# Patient Record
Sex: Female | Born: 1970
Health system: Southern US, Community
[De-identification: ages and names within clinical notes are randomized; demographics above are authoritative.]

## PROBLEM LIST (undated history)

## (undated) DIAGNOSIS — T7840XA Allergy, unspecified, initial encounter: Secondary | ICD-10-CM

## (undated) DIAGNOSIS — I499 Cardiac arrhythmia, unspecified: Secondary | ICD-10-CM

## (undated) DIAGNOSIS — R112 Nausea with vomiting, unspecified: Secondary | ICD-10-CM

## (undated) DIAGNOSIS — Z8709 Personal history of other diseases of the respiratory system: Secondary | ICD-10-CM

## (undated) DIAGNOSIS — M1990 Unspecified osteoarthritis, unspecified site: Secondary | ICD-10-CM

## (undated) DIAGNOSIS — J329 Chronic sinusitis, unspecified: Secondary | ICD-10-CM

## (undated) DIAGNOSIS — F32A Depression, unspecified: Secondary | ICD-10-CM

## (undated) DIAGNOSIS — R05 Cough: Secondary | ICD-10-CM

## (undated) DIAGNOSIS — G43909 Migraine, unspecified, not intractable, without status migrainosus: Secondary | ICD-10-CM

## (undated) DIAGNOSIS — J45909 Unspecified asthma, uncomplicated: Secondary | ICD-10-CM

## (undated) DIAGNOSIS — E785 Hyperlipidemia, unspecified: Secondary | ICD-10-CM

## (undated) DIAGNOSIS — Z9889 Other specified postprocedural states: Secondary | ICD-10-CM

## (undated) DIAGNOSIS — F419 Anxiety disorder, unspecified: Secondary | ICD-10-CM

## (undated) DIAGNOSIS — I10 Essential (primary) hypertension: Secondary | ICD-10-CM

## (undated) DIAGNOSIS — F329 Major depressive disorder, single episode, unspecified: Secondary | ICD-10-CM

## (undated) DIAGNOSIS — Z973 Presence of spectacles and contact lenses: Secondary | ICD-10-CM

## (undated) HISTORY — DX: Allergy, unspecified, initial encounter: T78.40XA

## (undated) HISTORY — PX: WISDOM TOOTH EXTRACTION: SHX21

## (undated) HISTORY — DX: Migraine, unspecified, not intractable, without status migrainosus: G43.909

## (undated) HISTORY — DX: Unspecified osteoarthritis, unspecified site: M19.90

## (undated) HISTORY — DX: Cardiac arrhythmia, unspecified: I49.9

## (undated) HISTORY — DX: Hyperlipidemia, unspecified: E78.5

## (undated) HISTORY — DX: Anxiety disorder, unspecified: F41.9

## (undated) HISTORY — DX: Cough: R05

## (undated) HISTORY — PX: TONSILLECTOMY AND ADENOIDECTOMY: SUR1326

## (undated) HISTORY — DX: Essential (primary) hypertension: I10

## (undated) HISTORY — DX: Chronic sinusitis, unspecified: J32.9

---

## 1999-05-31 ENCOUNTER — Other Ambulatory Visit: Admission: RE | Admit: 1999-05-31 | Discharge: 1999-05-31 | Payer: Self-pay | Admitting: Family Medicine

## 2000-06-27 ENCOUNTER — Other Ambulatory Visit: Admission: RE | Admit: 2000-06-27 | Discharge: 2000-06-27 | Payer: Self-pay | Admitting: Family Medicine

## 2001-09-07 ENCOUNTER — Other Ambulatory Visit: Admission: RE | Admit: 2001-09-07 | Discharge: 2001-09-07 | Payer: Self-pay | Admitting: Family Medicine

## 2002-10-15 ENCOUNTER — Other Ambulatory Visit: Admission: RE | Admit: 2002-10-15 | Discharge: 2002-10-15 | Payer: Self-pay | Admitting: Family Medicine

## 2004-09-14 ENCOUNTER — Ambulatory Visit: Payer: Self-pay | Admitting: Internal Medicine

## 2004-10-21 ENCOUNTER — Ambulatory Visit: Payer: Self-pay | Admitting: Family Medicine

## 2008-12-01 ENCOUNTER — Encounter (INDEPENDENT_AMBULATORY_CARE_PROVIDER_SITE_OTHER): Payer: Self-pay | Admitting: *Deleted

## 2008-12-17 ENCOUNTER — Ambulatory Visit: Payer: Self-pay | Admitting: Family Medicine

## 2008-12-17 DIAGNOSIS — J069 Acute upper respiratory infection, unspecified: Secondary | ICD-10-CM | POA: Insufficient documentation

## 2008-12-17 DIAGNOSIS — M5432 Sciatica, left side: Secondary | ICD-10-CM | POA: Insufficient documentation

## 2008-12-19 ENCOUNTER — Encounter (INDEPENDENT_AMBULATORY_CARE_PROVIDER_SITE_OTHER): Payer: Self-pay | Admitting: *Deleted

## 2008-12-19 LAB — CONVERTED CEMR LAB
ALT: 19 units/L (ref 0–35)
Albumin: 4.2 g/dL (ref 3.5–5.2)
CO2: 30 meq/L (ref 19–32)
Calcium: 9 mg/dL (ref 8.4–10.5)
Creatinine, Ser: 0.5 mg/dL (ref 0.4–1.2)
GFR calc non Af Amer: 145.43 mL/min (ref >60)
Glucose, Bld: 88 mg/dL (ref 70–99)
HDL: 38.5 mg/dL — ABNORMAL LOW (ref >39.00)
Hemoglobin: 14.5 g/dL (ref 12.0–15.0)
Lymphocytes Relative: 17.5 % (ref 12.0–46.0)
MCHC: 34.4 g/dL (ref 30.0–36.0)
Monocytes Absolute: 0.8 10*3/uL (ref 0.1–1.0)
Monocytes Relative: 7.4 % (ref 3.0–12.0)
Neutrophils Relative %: 69.6 % (ref 43.0–77.0)
RDW: 11.8 % (ref 11.5–14.6)
Sodium: 143 meq/L (ref 135–145)
TSH: 1.04 microintl units/mL (ref 0.35–5.50)
Total CHOL/HDL Ratio: 5
Total Protein: 7.9 g/dL (ref 6.0–8.3)
Triglycerides: 82 mg/dL (ref 0.0–149.0)
VLDL: 16.4 mg/dL (ref 0.0–40.0)

## 2009-01-07 ENCOUNTER — Ambulatory Visit: Payer: Self-pay | Admitting: Family Medicine

## 2011-04-26 LAB — HM PAP SMEAR

## 2011-05-19 ENCOUNTER — Institutional Professional Consult (permissible substitution): Payer: Self-pay | Admitting: Internal Medicine

## 2011-05-27 ENCOUNTER — Encounter: Payer: Self-pay | Admitting: Internal Medicine

## 2011-05-30 ENCOUNTER — Encounter: Payer: Self-pay | Admitting: Internal Medicine

## 2011-05-30 ENCOUNTER — Ambulatory Visit (INDEPENDENT_AMBULATORY_CARE_PROVIDER_SITE_OTHER): Payer: BC Managed Care – PPO | Admitting: Internal Medicine

## 2011-05-30 ENCOUNTER — Telehealth: Payer: Self-pay | Admitting: Internal Medicine

## 2011-05-30 VITALS — BP 152/90 | HR 114 | Temp 98.3°F | Ht 64.0 in | Wt 245.6 lb

## 2011-05-30 DIAGNOSIS — R05 Cough: Secondary | ICD-10-CM

## 2011-05-30 DIAGNOSIS — R059 Cough, unspecified: Secondary | ICD-10-CM

## 2011-05-30 MED ORDER — AMOXICILLIN-POT CLAVULANATE 875-125 MG PO TABS: 1.0000 | ORAL_TABLET | Freq: Two times a day (BID) | ORAL | Status: AC

## 2011-05-30 MED ORDER — FAMOTIDINE 20 MG PO TABS: 20.0000 mg | ORAL_TABLET | Freq: Every day | ORAL | Status: DC

## 2011-05-30 MED ORDER — PREDNISONE (PAK) 10 MG PO TABS: ORAL_TABLET | ORAL | Status: AC

## 2011-05-30 NOTE — Telephone Encounter (Signed)
Pepcid RX sent to [facility] on [address]. Pharmacist aware.

## 2011-05-30 NOTE — Patient Instructions (Addendum)
Augmentin 875 mg one twice daily(bfast and suppper with large glass of water) x 10 days - eat yogurt for lunch.  Prednisone 10 mg take  4 each am x 2 days,   2 each am x 2 days,  1 each am x2days and stop    Try prilosec 20mg   Take 30-60 min before first meal of the day and Pepcid 20 mg one bedtime until cough is completely gone for at least a week without the need for cough suppression  I think of reflux for chronic cough like I do oxygen for fire (doesn't cause the fire but once you get the oxygen suppressed it usually goes away regardless of the exact cause).   GERD (REFLUX)  is an extremely common cause of respiratory symptoms, many times with no significant heartburn at all.    It can be treated with medication, but also with lifestyle changes including avoidance of late meals, excessive alcohol, smoking cessation, and avoid fatty foods, chocolate, peppermint, colas, red wine, and acidic juices such as orange juice.  NO MINT OR MENTHOL PRODUCTS SO NO COUGH DROPS  USE SUGARLESS CANDY INSTEAD (jolley ranchers or Stover's)  NO OIL BASED VITAMINS - use powdered substitutes.  Please schedule a follow up office visit in 2 weeks, sooner if needed

## 2011-05-30 NOTE — Progress Notes (Signed)
  Subjective:    Patient ID: Kayla Combs, female    DOB: Jul 20, 1970, 41 y.o.   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 / h/o smoking quit Dec 2012 with dx asthma as child with need ventolin need outgrew by age teens but always tendencies for colds to chest c occ need for prednisone and advair but not maintained on it then onset of uri Nov 2012 and coughing ever since so referred by [facility] 05/30/2011 for refractory cough   05/30/2011 1st pulmonary eval cc acute onset cough with uri mid Nov 2012  quit smoking early Dec 2012 with swine flu when return from [place]  By car overall by day of ov but cough daily still esp in am's after stirring but no longer coughing to point of vomiting or urinary incontinence p 3 ov's with last rx prednisone and tamiflu and zpak.  Cough is productive of dark yellow.  Not feeling need for ventolin and has not used advair this time. No overt hb now.  Sleeping ok without nocturnal  or early am awakening with exacerbation  of respiratory  c/o's or need for noct saba. Also denies any obvious fluctuation of symptoms with weather or environmental changes or other aggravating or alleviating factors except as outlined above.    Review of Systems  Constitutional: Negative for fever, chills and unexpected weight change.  HENT: Positive for ear pain, congestion and sneezing. Negative for nosebleeds, sore throat, rhinorrhea, trouble swallowing, dental problem, voice change, postnasal drip and sinus pressure.   Eyes: Negative for visual disturbance.  Respiratory: Positive for cough. Negative for choking and shortness of breath.   Cardiovascular: Negative for chest pain and leg swelling.  Gastrointestinal: Negative for vomiting, abdominal pain and diarrhea.  Genitourinary: Negative for difficulty urinating.  Musculoskeletal: Negative for arthralgias.  Skin: Negative for rash.  Neurological: Negative for tremors, syncope and  headaches.  Hematological: Does not bruise/bleed easily.       Objective:   Physical Exam  amb obese wf nad   Wt 245 05/30/2011   HEENT: nl dentition, turbinates, and orophanx. Nl external ear canals without cough reflex   NECK :  without JVD/Nodes/TM/ nl carotid upstrokes bilaterally   LUNGS: no acc muscle use, clear to A and P bilaterally without cough on insp or exp maneuvers   CV:  RRR  no s3 or murmur or increase in P2, no edema   ABD:  soft and nontender with nl excursion in the supine position. No bruits or organomegaly, bowel sounds nl  MS:  warm without deformities, calf tenderness, cyanosis or clubbing  SKIN: warm and dry without lesions    NEURO:  alert, approp, no deficits     Outside cxr reviewed from 05/05/11 and is nl    Assessment & Plan:

## 2011-05-31 DIAGNOSIS — R059 Cough, unspecified: Secondary | ICD-10-CM | POA: Insufficient documentation

## 2011-05-31 DIAGNOSIS — R05 Cough: Secondary | ICD-10-CM | POA: Insufficient documentation

## 2011-05-31 NOTE — Assessment & Plan Note (Signed)
The most common causes of chronic cough in immunocompetent adults include the following: upper airway cough syndrome (UACS), previously referred to as postnasal drip syndrome (PNDS), which is caused by variety of rhinosinus conditions; (2) asthma; (3) GERD; (4) chronic bronchitis from cigarette smoking or other inhaled environmental irritants; (5) nonasthmatic eosinophilic bronchitis; and (6) bronchiectasis.   These conditions, singly or in combination, have accounted for up to 94% of the causes of chronic cough in prospective studies.   Other conditions have constituted no >6% of the causes in prospective studies These have included bronchogenic carcinoma, chronic interstitial pneumonia, sarcoidosis, left ventricular failure, ACEI-induced cough, and aspiration from a condition associated with pharyngeal dysfunction.   .Chronic cough is often simultaneously caused by more than one condition. A single cause has been found from 38 to 82% of the time, multiple causes from 18 to 62%. Multiply caused cough has been the result of three diseases up to 42% of the time.      This is most likely  Classic Upper airway cough syndrome, so named because it's frequently impossible to sort out how much is  CR/sinusitis with freq throat clearing (which can be related to primary GERD)   vs  causing  secondary (" extra esophageal")  GERD from wide swings in gastric pressure that occur with throat clearing, often  promoting self use of mint and menthol lozenges that reduce the lower esophageal sphincter tone and exacerbate the problem further in a cyclical fashion.   These are the same pts who not infrequently have failed to tolerate ace inhibitors,  dry powder inhalers or biphosphonates or report having reflux symptoms that don't respond to standard doses of PPI , and are easily confused as having aecopd or asthma flares.  For now rx for gerd and sinusitis then next step is sinus CT

## 2011-06-13 ENCOUNTER — Ambulatory Visit: Payer: BC Managed Care – PPO | Admitting: Internal Medicine

## 2011-06-20 ENCOUNTER — Other Ambulatory Visit (INDEPENDENT_AMBULATORY_CARE_PROVIDER_SITE_OTHER): Payer: BC Managed Care – PPO

## 2011-06-20 ENCOUNTER — Encounter: Payer: Self-pay | Admitting: Internal Medicine

## 2011-06-20 ENCOUNTER — Ambulatory Visit (INDEPENDENT_AMBULATORY_CARE_PROVIDER_SITE_OTHER)
Admission: RE | Admit: 2011-06-20 | Discharge: 2011-06-20 | Disposition: A | Discharge status: Home / Self Care | Payer: BC Managed Care – PPO | Source: Ambulatory Visit | Attending: Internal Medicine | Admitting: Internal Medicine

## 2011-06-20 ENCOUNTER — Ambulatory Visit (INDEPENDENT_AMBULATORY_CARE_PROVIDER_SITE_OTHER): Payer: BC Managed Care – PPO | Admitting: Internal Medicine

## 2011-06-20 VITALS — BP 152/86 | HR 120 | Temp 97.9°F | Ht 64.0 in | Wt 247.0 lb

## 2011-06-20 DIAGNOSIS — R059 Cough, unspecified: Secondary | ICD-10-CM

## 2011-06-20 DIAGNOSIS — R05 Cough: Secondary | ICD-10-CM

## 2011-06-20 LAB — CBC WITH DIFFERENTIAL/PLATELET
Basophils Absolute: 0.2 10*3/uL — ABNORMAL HIGH (ref 0.0–0.1)
Eosinophils Absolute: 0.4 10*3/uL (ref 0.0–0.7)
Hemoglobin: 14.7 g/dL (ref 12.0–15.0)
Lymphocytes Relative: 19.3 % (ref 12.0–46.0)
Monocytes Relative: 5.3 % (ref 3.0–12.0)
Neutro Abs: 8.6 10*3/uL — ABNORMAL HIGH (ref 1.4–7.7)
Neutrophils Relative %: 70.8 % (ref 43.0–77.0)
RBC: 4.91 Mil/uL (ref 3.87–5.11)
RDW: 13.6 % (ref 11.5–14.6)

## 2011-06-20 MED ORDER — TRAMADOL HCL 50 MG PO TABS: ORAL_TABLET | ORAL | Status: AC

## 2011-06-20 MED ORDER — PREDNISONE (PAK) 10 MG PO TABS: ORAL_TABLET | ORAL | Status: AC

## 2011-06-20 NOTE — Patient Instructions (Addendum)
The key to effective treatment for your cough is eliminating the non-stop cycle of cough you're stuck in long enough to let your airway heal completely and then see if there is anything still making you cough once you stop the cough suppression, but this should take no more than 5 days to figuure out  First take delsym two tsp every 12 hours and supplement if needed with  tramadol 50 mg up to 2 every 4 hours to suppress the urge to cough. Swallowing water or using ice chips/non mint and menthol containing candies (such as lifesavers or sugarless jolly ranchers) are also effective.  You should rest your voice and avoid activities that you know make you cough.  Once you have eliminated the cough for 3 straight days try reducing the tramadol first,  then the delsym as tolerated.    Try prilosec 20mg   Take 30-60 min before first meal of the day and Pepcid 20 mg one bedtime until cough is completely gone for at least a week without the need for cough suppression  I think of reflux for chronic cough like I do oxygen for fire (doesn't cause the fire but once you get the oxygen suppressed it usually goes away regardless of the exact cause).  GERD (REFLUX)  is an extremely common cause of respiratory symptoms, many times with no significant heartburn at all.    It can be treated with medication, but also with lifestyle changes including avoidance of late meals, excessive alcohol, smoking cessation, and avoid fatty foods, chocolate, peppermint, colas, red wine, and acidic juices such as orange juice.  NO MINT OR MENTHOL PRODUCTS SO NO COUGH DROPS  USE SUGARLESS CANDY INSTEAD (jolley ranchers or Stover's)  NO OIL BASED VITAMINS - use powdered substitutes.    Prednisone 10 mg take  4 each am x 2 days,   2 each am x 2 days,  1 each am x2days and stop    Please remember to go to the lab and x-ray department downstairs for your tests - we will call you with the results when they are available.     Please see  patient coordinator before you leave today  to schedule Sinus CT next - if this is negative I recommend chlortrimeton 4 mg every 6 hours as needed for drainage.   If you are satisfied with your treatment plan let your doctor know and he/she can either refill your medications or you can return here when your prescription runs out.     If in any way you are not 100% satisfied,  please tell us.  If 100% better, tell your friends!

## 2011-06-20 NOTE — Assessment & Plan Note (Signed)
Continue to feel this is most likely  Classic Upper airway cough syndrome, so named because it's frequently impossible to sort out how much is  CR/sinusitis with freq throat clearing (which can be related to primary GERD)   vs  causing  secondary (" extra esophageal")  GERD from wide swings in gastric pressure that occur with throat clearing, often  promoting self use of mint and menthol lozenges that reduce the lower esophageal sphincter tone and exacerbate the problem further in a cyclical fashion.   These are the same pts (now being labeled as having "irritable larynx syndrome" by some cough centers) who not infrequently have a history of having failed to tolerate ace inhibitors,  dry powder inhalers or biphosphonates or report having atypical reflux symptoms that don't respond to standard doses of PPI , and are easily confused as having aecopd or asthma flares by even experienced allergists/ pulmonologists.   Next step is eval for sinusitis and if neg then add 1st gen H1 per guidelines.

## 2011-06-20 NOTE — Progress Notes (Signed)
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   05/30/2011 1st pulmonary eval cc acute onset cough with uri mid Nov 2012  quit smoking early Dec 2012 with swine flu when return from [place]  By car overall by day of ov but cough daily still esp in am's after stirring but no longer coughing to point of vomiting or urinary incontinence p 3 ov's with last rx prednisone and tamiflu and zpak.  Cough is productive of dark yellow.  Not feeling need for ventolin and has not used advair this time. No overt hb now. rec Augmentin 875 mg one twice daily(bfast and suppper with large glass of water) x 10 days - eat yogurt for lunch. Prednisone 10 mg take  4 each am x 2 days,   2 each am x 2 days,  1 each am x2days and stop  Try prilosec 20mg   Take 30-60 min before first meal of the day and Pepcid 20 mg one bedtime until cough is completely gone for at least a week without the need for cough suppression GERD diet  Please schedule a follow up office visit in 2 weeks, sooner if needed     06/20/2011 f/u ov/Michaelanthony Kempton 75% better p finished rx and then  Worse again abuptly since Feb 12 th with increase cough with slt yellow mucus,  Then noted breathing worse since the 14th even if not coughing.  Has not tried ventolin. Breathing is bad at rest, not always proportionate to activities. Mucus turned yellow again after had cleared somewhat on augmentin.    Sleeping ok without nocturnal  or early am awakening with exacerbation  of respiratory  c/o's or need for noct saba. Also denies any obvious fluctuation  of symptoms with weather or environmental changes or other aggravating or alleviating factors except as outlined above.  ROS  At present neg for  any significant sore throat, dysphagia, itching, sneezing,  nasal congestion or excess/ purulent secretions,  fever, chills, sweats, unintended wt loss, pleuritic or exertional cp, hempoptysis, orthopnea pnd or leg swelling.  Also denies presyncope, palpitations, heartburn, abdominal pain, nausea, vomiting, diarrhea  or change in bowel or urinary habits, dysuria,hematuria,  rash, arthralgias, visual complaints, headache, numbness weakness or ataxia.      Objective:   Physical Exam  amb obese wf nad   Wt 245 05/30/2011 > 06/20/2011 247  HEENT: nl dentition, turbinates, and orophanx. Nl external ear canals without cough reflex   NECK :  without JVD/Nodes/TM/ nl carotid upstrokes bilaterally   LUNGS: no acc muscle use, clear to A and P bilaterally without cough on insp or exp maneuvers   CV:  RRR  no s3 or murmur or increase in P2, no edema   ABD:  soft and nontender with nl excursion in the supine position. No bruits or organomegaly, bowel sounds nl  MS:  warm without deformities, calf tenderness, cyanosis or clubbing       Outside cxr reviewed from 05/05/11 and is nl  CXR  06/20/2011 :  No acute cardiopulmonary findings.      Assessment & Plan:

## 2011-06-21 ENCOUNTER — Other Ambulatory Visit: Payer: Self-pay | Admitting: Internal Medicine

## 2011-06-21 ENCOUNTER — Ambulatory Visit (INDEPENDENT_AMBULATORY_CARE_PROVIDER_SITE_OTHER)
Admission: RE | Admit: 2011-06-21 | Discharge: 2011-06-21 | Disposition: A | Discharge status: Home / Self Care | Payer: BC Managed Care – PPO | Source: Ambulatory Visit | Attending: Cardiovascular Disease | Admitting: Cardiovascular Disease

## 2011-06-21 ENCOUNTER — Encounter: Payer: Self-pay | Admitting: Internal Medicine

## 2011-06-21 DIAGNOSIS — R059 Cough, unspecified: Secondary | ICD-10-CM

## 2011-06-21 DIAGNOSIS — R05 Cough: Secondary | ICD-10-CM

## 2011-06-21 DIAGNOSIS — J329 Chronic sinusitis, unspecified: Secondary | ICD-10-CM

## 2011-06-21 MED ORDER — AMOXICILLIN-POT CLAVULANATE 875-125 MG PO TABS: 1.0000 | ORAL_TABLET | Freq: Two times a day (BID) | ORAL | Status: DC

## 2011-06-21 NOTE — Progress Notes (Signed)
Quick Note:  Spoke with pt and notified of results per Dr. Wert. Pt verbalized understanding and denied any questions.  ______ 

## 2011-06-22 ENCOUNTER — Encounter: Payer: Self-pay | Admitting: Internal Medicine

## 2011-06-22 LAB — ALLERGY PROFILE REGION II-DC, DE, MD, ~~LOC~~, VA
Aspergillus fumigatus, IgG: <0.1 kU/L (ref <0.35)
Bermuda Grass: <0.1 kU/L (ref <0.35)
Cladosporium Herbarum: <0.1 kU/L (ref <0.35)
Common Ragweed: <0.1 kU/L (ref <0.35)
Elm IgE: <0.1 kU/L (ref <0.35)
IgE (Immunoglobulin E), Serum: 12.1 IU/mL (ref 0.0–180.0)
Johnson Grass: <0.1 kU/L (ref <0.35)
Lamb's Quarters: <0.1 kU/L (ref <0.35)
Meadow Grass: <0.1 kU/L (ref <0.35)

## 2011-06-22 NOTE — Progress Notes (Signed)
Quick Note:  Spoke with pt and notified of results per Dr. Wert. Pt verbalized understanding and denied any questions.  ______ 

## 2011-06-28 ENCOUNTER — Telehealth: Payer: Self-pay | Admitting: Internal Medicine

## 2011-06-28 NOTE — Telephone Encounter (Signed)
Pt had a ct of sinus on 06-21-11 which was positive for sinusitis. The instructions are as follows per MW:  Notes Recorded by Sandrea Hughs, MD on 06/21/2011 at 3:05 PM Call patient : Study c/w acute and chronic sinusits > rec aumentin 875 bid x 21 days then repeat (other option is go directly to ent)   But the CT was scheduled for 21days after she took the last dose of abx so it needs to be r/s. I called [facility] and had CT changed to 07-13-11 at 10am. Pt aware. Carron Curie, CMA

## 2011-06-30 ENCOUNTER — Telehealth: Payer: Self-pay | Admitting: Internal Medicine

## 2011-06-30 MED ORDER — AMOXICILLIN-POT CLAVULANATE 875-125 MG PO TABS: 1.0000 | ORAL_TABLET | Freq: Two times a day (BID) | ORAL | Status: AC

## 2011-06-30 NOTE — Telephone Encounter (Signed)
Rx was corrected and sent to pharm. Should have been for # 42 tablets and so I sent the other # 21 tablets. Pt aware.

## 2011-07-13 ENCOUNTER — Encounter: Payer: Self-pay | Admitting: Internal Medicine

## 2011-07-13 ENCOUNTER — Ambulatory Visit (INDEPENDENT_AMBULATORY_CARE_PROVIDER_SITE_OTHER)
Admission: RE | Admit: 2011-07-13 | Discharge: 2011-07-13 | Disposition: A | Discharge status: Home / Self Care | Payer: BC Managed Care – PPO | Source: Ambulatory Visit | Attending: Internal Medicine | Admitting: Internal Medicine

## 2011-07-13 DIAGNOSIS — J329 Chronic sinusitis, unspecified: Secondary | ICD-10-CM

## 2011-07-14 ENCOUNTER — Telehealth: Payer: Self-pay | Admitting: Internal Medicine

## 2011-07-14 DIAGNOSIS — K7290 Hepatic failure, unspecified without coma: Secondary | ICD-10-CM

## 2011-07-14 DIAGNOSIS — J329 Chronic sinusitis, unspecified: Secondary | ICD-10-CM

## 2011-07-14 NOTE — Telephone Encounter (Signed)
Spoke with pt and notified of results per Dr. Sherene Sires. Pt verbalized understanding and denied any questions. Referral to ENT was made.

## 2011-07-14 NOTE — Progress Notes (Signed)
Quick Note:  Spoke with pt and notified of results per Dr. Wert. Pt verbalized understanding and denied any questions.  ______ 

## 2011-08-01 ENCOUNTER — Other Ambulatory Visit: Payer: BC Managed Care – PPO

## 2012-03-06 ENCOUNTER — Ambulatory Visit (INDEPENDENT_AMBULATORY_CARE_PROVIDER_SITE_OTHER): Payer: BC Managed Care – PPO | Admitting: Family Medicine

## 2012-03-06 ENCOUNTER — Encounter: Payer: Self-pay | Admitting: Family Medicine

## 2012-03-06 VITALS — BP 138/98 | HR 96 | Temp 98.2°F | Wt 249.0 lb

## 2012-03-06 DIAGNOSIS — F411 Generalized anxiety disorder: Secondary | ICD-10-CM | POA: Insufficient documentation

## 2012-03-06 DIAGNOSIS — F418 Other specified anxiety disorders: Secondary | ICD-10-CM

## 2012-03-06 DIAGNOSIS — IMO0001 Reserved for inherently not codable concepts without codable children: Secondary | ICD-10-CM

## 2012-03-06 DIAGNOSIS — R030 Elevated blood-pressure reading, without diagnosis of hypertension: Secondary | ICD-10-CM

## 2012-03-06 DIAGNOSIS — Z23 Encounter for immunization: Secondary | ICD-10-CM

## 2012-03-06 DIAGNOSIS — I10 Essential (primary) hypertension: Secondary | ICD-10-CM | POA: Insufficient documentation

## 2012-03-06 DIAGNOSIS — E781 Pure hyperglyceridemia: Secondary | ICD-10-CM

## 2012-03-06 DIAGNOSIS — F341 Dysthymic disorder: Secondary | ICD-10-CM

## 2012-03-06 DIAGNOSIS — Z0000 Encounter for general adult medical examination without abnormal findings: Secondary | ICD-10-CM | POA: Insufficient documentation

## 2012-03-06 MED ORDER — VENLAFAXINE HCL ER 37.5 MG PO CP24: 37.5000 mg | ORAL_CAPSULE | Freq: Every day | ORAL | Status: DC

## 2012-03-06 NOTE — Patient Instructions (Addendum)
Follow up in 1 month to recheck mood STOP the lexapro START the Effexor- we will likely need to go up on this so don't get frustrated Keep up the good work on diet and exercise We'll plan to start the Fenofibrate for the trigs next visit Call with any questions or concerns Happy Thanksgiving and Early Birthday!

## 2012-03-06 NOTE — Assessment & Plan Note (Signed)
New to provider.  Pt's sxs not well controlled on Lexapro.  Discussed adding Wellbutrin vs switching to new med.  Pt prefers to only take 1 med.  Will switch to effexor and see if sxs improve.  Pt expressed understanding and is in agreement w/ plan.

## 2012-03-06 NOTE — Assessment & Plan Note (Signed)
New to provider.  Pt is asymptomatic.  Reports home BPs are normal.  Will hold off on meds at this time and recheck at next visit- if still elevated will discuss medication.  Pt expressed understanding and is in agreement w/ plan.

## 2012-03-06 NOTE — Progress Notes (Signed)
  Subjective:    Patient ID: Kayla Combs, female    DOB: [birth year], 41 y.o.   MRN: [id number]  HPI CPE- GYN O'Keefe, ENT Irven Easterly Wert.  Podiatry- [facility]  UTD on mammo/pap.  Hyperlipidemia- pt had elevated cholesterol at GYN, worked on diet and exercise.  Had labs rechecked in September and lipids had improved.  Total cholesterol 184, Trigs 249, LDL 99, HDL 35.  HTN- chronic problem, not currently on meds, is checking BP at home and is in the 110-120s/70-80s.  Is working on diet and exercise.  Depression- GYN started Lexapro 10mg .  Pt increased this to 20mg  due to mood swings.  Still feels short tempered, easy to snap despite 20mg  dose.  Still feeling depressed in addition to being 'on edge'.  Notes she is quick to anger which is not typical.  Not sleeping well.   Review of Systems Patient reports no vision/ hearing changes, adenopathy,fever, weight change,  persistant/recurrent hoarseness , swallowing issues, chest pain, palpitations, edema, persistant/recurrent cough, hemoptysis, dyspnea (rest/exertional/paroxysmal nocturnal), gastrointestinal bleeding (melena, rectal bleeding), abdominal pain, significant heartburn, bowel changes, GU symptoms (dysuria, hematuria, incontinence), Gyn symptoms (abnormal  bleeding, pain),  syncope, focal weakness, memory loss, numbness & tingling, skin/hair/nail changes, abnormal bruising or bleeding.     Objective:   Physical Exam General Appearance:    Alert, cooperative, no distress, appears stated age, obese  Head:    Normocephalic, without obvious abnormality, atraumatic  Eyes:    PERRL, conjunctiva/corneas clear, EOM's intact, fundi    benign, both eyes  Ears:    Normal TM's and external ear canals, both ears  Nose:   Nares normal, septum midline, mucosa normal, no drainage    or sinus tenderness  Throat:   Lips, mucosa, and tongue normal; teeth and gums normal  Neck:   Supple, symmetrical, trachea midline, no adenopathy;    Thyroid:  no enlargement/tenderness/nodules  Back:     Symmetric, no curvature, ROM normal, no CVA tenderness  Lungs:     Clear to auscultation bilaterally, respirations unlabored  Chest Wall:    No tenderness or deformity   Heart:    Regular rate and rhythm, S1 and S2 normal, no murmur, rub   or gallop  Breast Exam:    Deferred to GYN  Abdomen:     Soft, non-tender, bowel sounds active all four quadrants,    no masses, no organomegaly  Genitalia:    Deferred to GYN  Rectal:    Extremities:   Extremities normal, atraumatic, no cyanosis or edema  Pulses:   2+ and symmetric all extremities  Skin:   Skin color, texture, turgor normal, no rashes or lesions  Lymph nodes:   Cervical, supraclavicular, and axillary nodes normal  Neurologic:   CNII-XII intact, normal strength, sensation and reflexes    throughout          Assessment & Plan:

## 2012-03-06 NOTE — Assessment & Plan Note (Signed)
Pt's PE WNL w/ exception of obesity.  UTD on health maintenance.  Check labs.  Anticipatory guidance provided.  

## 2012-03-06 NOTE — Assessment & Plan Note (Signed)
New to provider.  Trigs are ~100 points higher than goal.  Plan is to start fenofibrate but will do that in 1 month to avoid starting 2 new meds at same time (effexor).  Pt expressed understanding and is in agreement w/ plan.

## 2012-04-05 ENCOUNTER — Ambulatory Visit (INDEPENDENT_AMBULATORY_CARE_PROVIDER_SITE_OTHER): Payer: BC Managed Care – PPO | Admitting: Family Medicine

## 2012-04-05 ENCOUNTER — Encounter: Payer: Self-pay | Admitting: Family Medicine

## 2012-04-05 VITALS — BP 138/94 | HR 103 | Temp 98.1°F | Ht 63.25 in | Wt 252.6 lb

## 2012-04-05 DIAGNOSIS — F418 Other specified anxiety disorders: Secondary | ICD-10-CM

## 2012-04-05 DIAGNOSIS — F341 Dysthymic disorder: Secondary | ICD-10-CM

## 2012-04-05 DIAGNOSIS — R030 Elevated blood-pressure reading, without diagnosis of hypertension: Secondary | ICD-10-CM

## 2012-04-05 DIAGNOSIS — IMO0001 Reserved for inherently not codable concepts without codable children: Secondary | ICD-10-CM

## 2012-04-05 MED ORDER — HYDROCHLOROTHIAZIDE 12.5 MG PO TABS: 12.5000 mg | ORAL_TABLET | Freq: Every day | ORAL | Status: DC

## 2012-04-05 MED ORDER — BUPROPION HCL ER (XL) 150 MG PO TB24: 150.0000 mg | ORAL_TABLET | Freq: Every day | ORAL | Status: DC

## 2012-04-05 NOTE — Assessment & Plan Note (Signed)
Unchanged.  No improvement w/ Effexor and pt now having palpitations.  Will stop effexor and switch to wellbutrin.  Reviewed supportive care and red flags that should prompt return.  Pt expressed understanding and is in agreement w/ plan.

## 2012-04-05 NOTE — Patient Instructions (Addendum)
Follow up in 2-4 weeks to recheck BP and potassium Start the HCTZ daily STOP the Effexor START the wellbutrin daily Call with any questions or concerns Happy Holidays!!!

## 2012-04-05 NOTE — Assessment & Plan Note (Signed)
New.  Pt now officially hypertensive.  Start HCTZ.  Follow closely to monitor BP and BMP.  Reviewed supportive care and red flags that should prompt return.  Pt expressed understanding and is in agreement w/ plan.

## 2012-04-05 NOTE — Progress Notes (Signed)
  Subjective:    Patient ID: Kayla Combs, female    DOB: [birth year], 41 y.o.   MRN: [id number]  HPI HTN- BP remains elevated, having HAs when BP is elevated.  Denies CP, SOB, visual changes, edema.  Some heart pounding in chest.  Had 1 night that heart was racing but this has not recurred.  Does have flushing and some current diarrhea.  Was screened for Pheo in early 26s- this was negative.  Depression- pt was started on effexor at last visit.  No relief or improvement in sxs.  Previously on Lexapro w/ some relief.  Reports sxs feel like 'impending doom'.   Review of Systems For ROS see HPI     Objective:   Physical Exam  Vitals reviewed. Constitutional: She is oriented to person, place, and time. She appears well-developed and well-nourished. No distress.  HENT:  Head: Normocephalic and atraumatic.  Eyes: Conjunctivae normal and EOM are normal. Pupils are equal, round, and reactive to light.  Neck: Normal range of motion. Neck supple. No thyromegaly present.  Cardiovascular: Normal rate, regular rhythm, normal heart sounds and intact distal pulses.   No murmur heard. Pulmonary/Chest: Effort normal and breath sounds normal. No respiratory distress.  Abdominal: Soft. She exhibits no distension. There is no tenderness.  Musculoskeletal: She exhibits no edema.  Lymphadenopathy:    She has no cervical adenopathy.  Neurological: She is alert and oriented to person, place, and time.  Skin: Skin is warm and dry.  Psychiatric: She has a normal mood and affect. Her behavior is normal.          Assessment & Plan:

## 2012-04-30 ENCOUNTER — Ambulatory Visit (INDEPENDENT_AMBULATORY_CARE_PROVIDER_SITE_OTHER): Payer: BC Managed Care – PPO | Admitting: Family Medicine

## 2012-04-30 ENCOUNTER — Encounter: Payer: Self-pay | Admitting: Family Medicine

## 2012-04-30 ENCOUNTER — Telehealth: Payer: Self-pay | Admitting: Family Medicine

## 2012-04-30 VITALS — BP 120/80 | HR 128 | Temp 98.6°F | Ht 63.75 in | Wt 247.0 lb

## 2012-04-30 DIAGNOSIS — F418 Other specified anxiety disorders: Secondary | ICD-10-CM

## 2012-04-30 DIAGNOSIS — R000 Tachycardia, unspecified: Secondary | ICD-10-CM | POA: Insufficient documentation

## 2012-04-30 DIAGNOSIS — F341 Dysthymic disorder: Secondary | ICD-10-CM

## 2012-04-30 DIAGNOSIS — I10 Essential (primary) hypertension: Secondary | ICD-10-CM

## 2012-04-30 LAB — BASIC METABOLIC PANEL
BUN: 12 mg/dL (ref 6–23)
Creatinine, Ser: 0.8 mg/dL (ref 0.4–1.2)
GFR: 88.24 mL/min (ref >60.00)

## 2012-04-30 LAB — CBC WITH DIFFERENTIAL/PLATELET
Eosinophils Relative: 2.5 % (ref 0.0–5.0)
Lymphocytes Relative: 13.2 % (ref 12.0–46.0)
Monocytes Absolute: 0.9 10*3/uL (ref 0.1–1.0)
Monocytes Relative: 5.4 % (ref 3.0–12.0)
Neutrophils Relative %: 78.7 % — ABNORMAL HIGH (ref 43.0–77.0)
Platelets: 421 10*3/uL — ABNORMAL HIGH (ref 150.0–400.0)
WBC: 16.5 10*3/uL — ABNORMAL HIGH (ref 4.5–10.5)

## 2012-04-30 LAB — TSH: TSH: 0.67 u[IU]/mL (ref 0.35–5.50)

## 2012-04-30 MED ORDER — METOPROLOL TARTRATE 25 MG PO TABS: ORAL_TABLET | ORAL | Status: DC

## 2012-04-30 MED ORDER — BUPROPION HCL ER (XL) 300 MG PO TB24: 300.0000 mg | ORAL_TABLET | Freq: Every day | ORAL | Status: DC

## 2012-04-30 NOTE — Telephone Encounter (Signed)
No need to fill metoprolol if she's seeing Dr Eden Emms tomorrow.

## 2012-04-30 NOTE — Assessment & Plan Note (Signed)
Improved since starting Wellbutrin.  Will increase dose and see if her 'sense of impending doom' resolves.  Will continue to follow closely.

## 2012-04-30 NOTE — Telephone Encounter (Signed)
I informed patient not to pick up the Metoprolol, patient understood.

## 2012-04-30 NOTE — Telephone Encounter (Signed)
Patient scheduled to see Dr. Eden Emms on 05/01/12.  Patient asking Dr. Beverely Low, since she is being seen so quickly, should she still begin taking the Metoprolol prescribed to her today?  Please advise.

## 2012-04-30 NOTE — Assessment & Plan Note (Signed)
Much improved since starting low dose HCTZ.  Continue.  Check labs to assess Cr and K+.

## 2012-04-30 NOTE — Progress Notes (Signed)
  Subjective:    Patient ID: Kayla Combs, female    DOB: 1970-06-02, 42 y.o.   MRN: [id number]  HPI HTN- BP much improved w/ low dose HCTZ.  No CP, SOB, visual changes, edema.  + HAs.  Hx of migraines.  HA is starting at attachment of trapezius and radiating around to bitemporal location.  Anxiety- pt reports w/ the exception of 1 day of crying, mood and anxiety has improved since starting Wellbutrin.  Still has 'impending sense of doom' but not to extent or degree that it was previously.  Tachycardia- pt's pulse elevated today, denies stress, rushing to get to appt.  Denies palpitations.   Review of Systems For ROS see HPI     Objective:   Physical Exam  Vitals reviewed. Constitutional: She is oriented to person, place, and time. She appears well-developed and well-nourished. No distress.  HENT:  Head: Normocephalic and atraumatic.  Eyes: Conjunctivae normal and EOM are normal. Pupils are equal, round, and reactive to light.  Neck: Normal range of motion. Neck supple. No thyromegaly present.  Cardiovascular: Regular rhythm, normal heart sounds and intact distal pulses.   No murmur heard.      Tachy but regular  Pulmonary/Chest: Effort normal and breath sounds normal. No respiratory distress.  Abdominal: Soft. She exhibits no distension. There is no tenderness.  Musculoskeletal: She exhibits no edema.  Lymphadenopathy:    She has no cervical adenopathy.  Neurological: She is alert and oriented to person, place, and time.  Skin: Skin is warm and dry.  Psychiatric: She has a normal mood and affect. Her behavior is normal.          Assessment & Plan:

## 2012-04-30 NOTE — Patient Instructions (Addendum)
Follow up in 2 weeks to recheck heart rate (you can cancel this if you've already seen cardiology) Follow up in 6-8 weeks to recheck mood Increase the Wellbutrin to 300mg - 2 of what you have at home, 1 of your new prescription Continue the HCTZ Add the Metoprolol twice daily to decrease heart rate We'll notify you of your lab results Call with any questions or concerns Hang in there!

## 2012-04-30 NOTE — Assessment & Plan Note (Signed)
New.  Pt reports that her HR is typically in the 100s but not usually 120s.  Pt denies current palpitations or other sxs.  Denies current anxiety.  EKG shows sinus rhythm w/ 2 PACs.  Will start low dose metoprolol to attempt to lower HR w/out excessively lowering BP.  Check CBC and TSH.  Refer to cards for complete evaluation.  Pt expressed understanding and is in agreement w/ plan.

## 2012-05-01 ENCOUNTER — Ambulatory Visit (INDEPENDENT_AMBULATORY_CARE_PROVIDER_SITE_OTHER): Payer: BC Managed Care – PPO | Admitting: Cardiovascular Disease

## 2012-05-01 ENCOUNTER — Telehealth: Payer: Self-pay | Admitting: *Deleted

## 2012-05-01 ENCOUNTER — Encounter: Payer: Self-pay | Admitting: Cardiovascular Disease

## 2012-05-01 VITALS — BP 142/92 | HR 114 | Ht 63.0 in | Wt 246.8 lb

## 2012-05-01 DIAGNOSIS — R0609 Other forms of dyspnea: Secondary | ICD-10-CM

## 2012-05-01 DIAGNOSIS — E781 Pure hyperglyceridemia: Secondary | ICD-10-CM

## 2012-05-01 DIAGNOSIS — R0600 Dyspnea, unspecified: Secondary | ICD-10-CM

## 2012-05-01 DIAGNOSIS — I10 Essential (primary) hypertension: Secondary | ICD-10-CM

## 2012-05-01 DIAGNOSIS — R0989 Other specified symptoms and signs involving the circulatory and respiratory systems: Secondary | ICD-10-CM

## 2012-05-01 DIAGNOSIS — R000 Tachycardia, unspecified: Secondary | ICD-10-CM

## 2012-05-01 NOTE — Telephone Encounter (Signed)
Patient called triage, had appt with cardiologist. He has suggested that Wellbutrin be stopped so that he can made better evaluation of heart. Her question is can she stop abruptly or does she need to wean. I already know the answer but need an MD recommendation to tell the patient. :)

## 2012-05-01 NOTE — Addendum Note (Signed)
Addended by: Scherrie Bateman E on: 05/01/2012 11:20 AM   Modules accepted: Orders

## 2012-05-01 NOTE — Progress Notes (Signed)
Patient ID: Kayla Combs, female   DOB: 1970-07-20, 42 y.o.   MRN: [id number] 42 yo depressed overweight female referred for tachycardia and dyspnea.  She has 3 small children at home and is overwhelmed Husband out of town 2 days per week and no family near by as she is originally from [place].  She has been overweight for a long time.  HR normally in 90's but has been in 100's last few months.  Somewhat coincides with starting welbutrin.  Has seen Dr Sherene Sires in past for asthma.  No real change in degree of dyspnea. No cough sputum or sncope.  TSH 1/13 normal.  Has had previous negative w/u for pheo. Given script for beta blocker but has not filled.  No history of DVT/PE and recent CXR with NAD.  She has elevated RBC with no history of   congenital heart disease.  Quit smoking in 2012  ROS: Denies fever, malais, weight loss, blurry vision, decreased visual acuity, cough, sputum, SOB, hemoptysis, pleuritic pain, palpitaitons, heartburn, abdominal pain, melena, lower extremity edema, claudication, or rash.  All other systems reviewed and negative   General: Affect appropriate Tearful overweight female HEENT: normal Neck supple with no adenopathy JVP normal no bruits no thyromegaly Lungs clear with no wheezing and good diaphragmatic motion Heart:  S1/S2 no murmur,rub, gallop or click PMI normal Abdomen: benighn, BS positve, no tenderness, no AAA no bruit.  No HSM or HJR Distal pulses intact with no bruits No edema Neuro non-focal Skin warm and dry No muscular weakness  Medications Current Outpatient Prescriptions  Medication Sig Dispense Refill  . buPROPion (WELLBUTRIN XL) 300 MG 24 hr tablet Take 1 tablet (300 mg total) by mouth daily.  30 tablet  3  . hydrochlorothiazide (HYDRODIURIL) 12.5 MG tablet Take 1 tablet (12.5 mg total) by mouth daily.  30 tablet  3  . ibuprofen (ADVIL,MOTRIN) 200 MG tablet Take 200 mg by mouth every 6 (six) hours as needed.      . Multiple  Vitamins-Minerals (MULTIVITAMIN WITH MINERALS) tablet Take 1 tablet by mouth daily.      . metoprolol tartrate (LOPRESSOR) 25 MG tablet 1/2 tab twice daily for heart rate  30 tablet  3    Allergies Review of patient's allergies indicates no known allergies.  Family History: Family History  Problem Relation Age of Onset  . Cervical cancer Mother     Social History: History   Social History  . Marital Status: Married    Spouse Name: N/A    Number of Children: 3  . Years of Education: N/A   Occupational History  . RN    Social History Main Topics  . Smoking status: Former Smoker -- 0.3 packs/day for 10 years    Types: Cigarettes    Quit date: 03/26/2011  . Smokeless tobacco: Never Used  . Alcohol Use: 0.0 - 2.0 oz/week    0-4 drink(s) per week  . Drug Use: No  . Sexually Active: Not on file   Other Topics Concern  . Not on file   Social History Narrative  . No narrative on file    Electrocardiogram:  ST rate 121 ICRBBB 04/30/12  ICRBBB and elevated rate new since 2010  Assessment and Plan

## 2012-05-01 NOTE — Telephone Encounter (Signed)
Pt needs to decrease her use to every other day for 2 weeks and then stop.  She also needs to be very honest w/ the cardiologist (and me!) that if her depression/anxiety becomes severe while attempting to stop meds, we need to know about it so we can better treat her.

## 2012-05-01 NOTE — Patient Instructions (Signed)
Your physician recommends that you schedule a follow-up appointment in: 3-4 WEEKS WITH DR [facility] Your physician recommends that you continue on your current medications as directed. Please refer to the Current Medication list given to you today.  TAKE METOPROLOL AS DIRECTED  Your physician has recommended that you wear a holter monitor. Holter monitors are medical devices that record the heart's electrical activity. Doctors most often use these monitors to diagnose arrhythmias. Arrhythmias are problems with the speed or rhythm of the heartbeat. The monitor is a small, portable device. You can wear one while you do your normal daily activities. This is usually used to diagnose what is causing palpitations/syncope (passing out). DX TACHYCARIDA  Your physician has requested that you have an echocardiogram. Echocardiography is a painless test that uses sound waves to create images of your heart. It provides your doctor with information about the size and shape of your heart and how well your heart's chambers and valves are working. This procedure takes approximately one hour. There are no restrictions for this procedure. DX DYSPNEA

## 2012-05-01 NOTE — Assessment & Plan Note (Signed)
Discussed low fat diet and portion control  Needs more exercise

## 2012-05-01 NOTE — Assessment & Plan Note (Signed)
Etiology not clear.  Echo to assess RV and LV function r/o pulmonary hypertension.  Holter to see average HR and normal drop in HR at night.  Needs weight loss.  Stop welbutrin and see how HR does off antidepressant  Consider starting different one per Dr Beverely Low

## 2012-05-01 NOTE — Telephone Encounter (Signed)
Advised by Dr. Beverely Low to have patient reduce medication to one every other day for 2 weeks then stop. Encourage patient to be honest with Cardiologist and Dr. Beverely Low regarding her depression so they can treat her appropriately. lmovm for pt to return my call.

## 2012-05-01 NOTE — Assessment & Plan Note (Signed)
HCTZ recently added by primary Low sodium diet and weight loss

## 2012-05-02 NOTE — Telephone Encounter (Signed)
Pt called to report that she is schedule on-15-13 to have the following: Holter monitor and echo. Ptt notes that card thinks that med might be contributing to her increase heart rate. Pt would also like to know if med can be stopped sooner then the recommended 2 weeks.Please advise

## 2012-05-02 NOTE — Telephone Encounter (Signed)
She can try every other day for 1 week and then stop- she may have some more withdraw symptoms this way (nausea, dizziness) but it would still be safe

## 2012-05-02 NOTE — Telephone Encounter (Signed)
Discuss with patient  

## 2012-05-09 ENCOUNTER — Ambulatory Visit (INDEPENDENT_AMBULATORY_CARE_PROVIDER_SITE_OTHER): Payer: BC Managed Care – PPO

## 2012-05-09 ENCOUNTER — Other Ambulatory Visit (HOSPITAL_COMMUNITY): Payer: BC Managed Care – PPO

## 2012-05-09 ENCOUNTER — Ambulatory Visit (HOSPITAL_COMMUNITY): Payer: BC Managed Care – PPO | Attending: Cardiovascular Disease

## 2012-05-09 DIAGNOSIS — I369 Nonrheumatic tricuspid valve disorder, unspecified: Secondary | ICD-10-CM | POA: Insufficient documentation

## 2012-05-09 DIAGNOSIS — R0989 Other specified symptoms and signs involving the circulatory and respiratory systems: Secondary | ICD-10-CM

## 2012-05-09 DIAGNOSIS — I10 Essential (primary) hypertension: Secondary | ICD-10-CM | POA: Insufficient documentation

## 2012-05-09 DIAGNOSIS — I059 Rheumatic mitral valve disease, unspecified: Secondary | ICD-10-CM | POA: Insufficient documentation

## 2012-05-09 DIAGNOSIS — R0600 Dyspnea, unspecified: Secondary | ICD-10-CM

## 2012-05-09 DIAGNOSIS — R000 Tachycardia, unspecified: Secondary | ICD-10-CM

## 2012-05-09 DIAGNOSIS — R0609 Other forms of dyspnea: Secondary | ICD-10-CM | POA: Insufficient documentation

## 2012-05-09 NOTE — Progress Notes (Signed)
Placed a 24 hr monitor on patient, went over instructions on how to use it and when to return it

## 2012-05-09 NOTE — Progress Notes (Signed)
Echocardiogram performed.  

## 2012-05-11 ENCOUNTER — Ambulatory Visit (INDEPENDENT_AMBULATORY_CARE_PROVIDER_SITE_OTHER): Payer: BC Managed Care – PPO

## 2012-05-11 DIAGNOSIS — R000 Tachycardia, unspecified: Secondary | ICD-10-CM

## 2012-05-11 DIAGNOSIS — R0600 Dyspnea, unspecified: Secondary | ICD-10-CM

## 2012-05-11 NOTE — Progress Notes (Signed)
Placed a 24 hr holter on patient and went over instructions on how to use it and when to return it.This is the second monitor the first one did not work.

## 2012-05-15 ENCOUNTER — Ambulatory Visit: Payer: BC Managed Care – PPO | Admitting: Family Medicine

## 2012-05-17 ENCOUNTER — Telehealth: Payer: Self-pay | Admitting: Cardiovascular Disease

## 2012-05-17 NOTE — Telephone Encounter (Signed)
New problem    Returning call back to nurse.   

## 2012-05-17 NOTE — Telephone Encounter (Signed)
Pt calling for Holter and echo results, told pt dr/nurse will be in tomorrow and will return the call, Pt agreeable to plan.

## 2012-05-21 ENCOUNTER — Encounter: Payer: Self-pay | Admitting: Cardiovascular Disease

## 2012-05-21 ENCOUNTER — Ambulatory Visit (INDEPENDENT_AMBULATORY_CARE_PROVIDER_SITE_OTHER): Payer: BC Managed Care – PPO | Admitting: Cardiovascular Disease

## 2012-05-21 VITALS — BP 132/80 | HR 104 | Ht 63.0 in | Wt 241.0 lb

## 2012-05-21 DIAGNOSIS — R000 Tachycardia, unspecified: Secondary | ICD-10-CM

## 2012-05-21 DIAGNOSIS — E781 Pure hyperglyceridemia: Secondary | ICD-10-CM

## 2012-05-21 DIAGNOSIS — F341 Dysthymic disorder: Secondary | ICD-10-CM

## 2012-05-21 DIAGNOSIS — F418 Other specified anxiety disorders: Secondary | ICD-10-CM

## 2012-05-21 DIAGNOSIS — I10 Essential (primary) hypertension: Secondary | ICD-10-CM

## 2012-05-21 NOTE — Progress Notes (Signed)
Patient ID: Kayla Combs, female   DOB: 10-23-1970, 42 y.o.   MRN: [id number] 42 yo depressed overweight female referred for tachycardia and dyspnea. She has 3 small children at home and is overwhelmed Husband out of town 2 days per week and no family near by as she is originally from [place]. She has been overweight for a long time. HR normally in 90's but has been in 100's last few months. Somewhat coincides with starting welbutrin. Has seen Dr Sherene Sires in past for asthma. No real change in degree of dyspnea. No cough sputum or sncope. TSH 1/13 normal. Has had previous negative w/u for pheo. Given script for beta blocker but has not filled. No history of DVT/PE and recent CXR with NAD. She has elevated RBC with no history of congenital heart disease. Quit smoking in 2012  Echo from 1/15 reviewed normal except mild MR Holter with no significant arrythmia average HR 84 min rate 53  Maximum 120    BP better with diuretic  Still needs to lose weight Functional dyspnea and atypical palpitations  ROS: Denies fever, malais, weight loss, blurry vision, decreased visual acuity, cough, sputum, SOB, hemoptysis, pleuritic pain, palpitaitons, heartburn, abdominal pain, melena, lower extremity edema, claudication, or rash.  All other systems reviewed and negative  General: Affect appropriate Obese white female HEENT: normal Neck supple with no adenopathy JVP normal no bruits no thyromegaly Lungs clear with no wheezing and good diaphragmatic motion Heart:  S1/S2 no murmur, no rub, gallop or click PMI normal Abdomen: benighn, BS positve, no tenderness, no AAA no bruit.  No HSM or HJR Distal pulses intact with no bruits No edema Neuro non-focal Skin warm and dry No muscular weakness   Current Outpatient Prescriptions  Medication Sig Dispense Refill  . hydrochlorothiazide (HYDRODIURIL) 12.5 MG tablet Take 1 tablet (12.5 mg total) by mouth daily.  30 tablet  3  . ibuprofen (ADVIL,MOTRIN) 200 MG  tablet Take 200 mg by mouth every 6 (six) hours as needed.      . metoprolol tartrate (LOPRESSOR) 25 MG tablet 1/2 tab twice daily for heart rate  30 tablet  3  . Multiple Vitamins-Minerals (MULTIVITAMIN WITH MINERALS) tablet Take 1 tablet by mouth daily.        Allergies  Review of patient's allergies indicates no known allergies.  Electrocardiogram:  NSR normal ECG  04/30/12  Assessment and Plan

## 2012-05-21 NOTE — Assessment & Plan Note (Signed)
Chronic problem F/U Tabori  Avoid sympathomimetic drugs

## 2012-05-21 NOTE — Assessment & Plan Note (Signed)
Functional normal variateion on Holter Continue beta blocker for HTN

## 2012-05-21 NOTE — Patient Instructions (Signed)
Your physician recommends that you schedule a follow-up appointment in: AS NEEDED  Your physician recommends that you continue on your current medications as directed. Please refer to the Current Medication list given to you today.  

## 2012-05-21 NOTE — Telephone Encounter (Signed)
PT AWARE OF HOLTER RESULTS AT TODAY'S OFFICE VISIT./CY

## 2012-05-21 NOTE — Assessment & Plan Note (Signed)
Improved continue current meds Low sodium diet and weight loss

## 2012-05-21 NOTE — Assessment & Plan Note (Signed)
Low fat diet LDL on ly 119  Weight loss again important

## 2012-05-23 ENCOUNTER — Other Ambulatory Visit: Payer: Self-pay | Admitting: *Deleted

## 2012-05-23 MED ORDER — METOPROLOL TARTRATE 25 MG PO TABS: ORAL_TABLET | ORAL | Status: DC

## 2012-05-23 MED ORDER — HYDROCHLOROTHIAZIDE 12.5 MG PO TABS: 12.5000 mg | ORAL_TABLET | Freq: Every day | ORAL | Status: DC

## 2012-05-23 NOTE — Telephone Encounter (Signed)
Spoke with the pt and informed her Rx's have been refilled and sent the pharmacy by e-script, and yes Dr. Beverely Low does want her to keep her follow-up appt.//AB/CMA

## 2012-06-25 ENCOUNTER — Ambulatory Visit: Payer: BC Managed Care – PPO | Admitting: Family Medicine

## 2012-06-28 ENCOUNTER — Encounter: Payer: Self-pay | Admitting: Family Medicine

## 2012-06-28 ENCOUNTER — Ambulatory Visit (INDEPENDENT_AMBULATORY_CARE_PROVIDER_SITE_OTHER): Payer: BC Managed Care – PPO | Admitting: Family Medicine

## 2012-06-28 VITALS — BP 124/90 | HR 105 | Temp 98.5°F | Ht 63.75 in | Wt 243.6 lb

## 2012-06-28 DIAGNOSIS — F341 Dysthymic disorder: Secondary | ICD-10-CM

## 2012-06-28 DIAGNOSIS — S838X9A Sprain of other specified parts of unspecified knee, initial encounter: Secondary | ICD-10-CM

## 2012-06-28 DIAGNOSIS — S86811A Strain of other muscle(s) and tendon(s) at lower leg level, right leg, initial encounter: Secondary | ICD-10-CM

## 2012-06-28 DIAGNOSIS — S86819A Strain of other muscle(s) and tendon(s) at lower leg level, unspecified leg, initial encounter: Secondary | ICD-10-CM | POA: Insufficient documentation

## 2012-06-28 DIAGNOSIS — F418 Other specified anxiety disorders: Secondary | ICD-10-CM

## 2012-06-28 MED ORDER — FLUTICASONE PROPIONATE 50 MCG/ACT NA SUSP: 2.0000 | Freq: Every day | NASAL | Status: DC

## 2012-06-28 NOTE — Assessment & Plan Note (Signed)
Improved.  Pt no longer on meds due to tachycardia.  sxs have improved w/ her regular exercise program.  No meds at this time.  Will continue to follow.  Applauded efforts.

## 2012-06-28 NOTE — Progress Notes (Signed)
  Subjective:    Patient ID: Kayla Combs, female    DOB: 06/05/1970, 42 y.o.   MRN: [id number]  HPI Anxiety/depression- not currently on meds due to tachycardia.  Now exercising 60-90 minutes daily.  Previously on Wellbutrin but this was felt to be causing tachycardia.  Feeling less anxious- 'impending sense of doom' has improved.  Is losing weight.  With exception of premenopausal sxs pt feels depression is better.  Anterior lower leg pain- bilateral, has started since pt began working out.  'it's like my skin hurts'.  No swelling, no redness.  Occurs after using the elliptical daily.  Has never had similar sxs.  Review of Systems For ROS see HPI     Objective:   Physical Exam  Vitals reviewed. Constitutional: She is oriented to person, place, and time. She appears well-developed and well-nourished. No distress.  Cardiovascular: Normal rate, regular rhythm, normal heart sounds and intact distal pulses.   Pulmonary/Chest: Effort normal and breath sounds normal. No respiratory distress. She has no wheezes. She has no rales.  Musculoskeletal: She exhibits no edema.  + TTP over tibialis anterior  Neurological: She is alert and oriented to person, place, and time. She has normal reflexes. No cranial nerve deficit. Coordination normal.  Skin: Skin is warm and dry.  Psychiatric: She has a normal mood and affect. Her behavior is normal. Judgment and thought content normal.          Assessment & Plan:

## 2012-06-28 NOTE — Patient Instructions (Addendum)
Follow up in 3-4 months to recheck weight loss progress and mood Don't do the elliptical daily- this is causing anterior leg pain and swelling which is irritating the nerve Ice your shins Ibuprofen as needed Call with any questions or concerns Keep up the good work!  I'm impressed!

## 2012-06-28 NOTE — Assessment & Plan Note (Signed)
New.  Most likely due to pt's recent ellipitical use.  Almost like a mild compartment syndrome.  Encouraged ice, NSAIDs, changing her exercise routine.  Reviewed supportive care and red flags that should prompt return.  Pt expressed understanding and is in agreement w/ plan.

## 2012-11-20 ENCOUNTER — Ambulatory Visit: Payer: BC Managed Care – PPO | Admitting: Family Medicine

## 2012-11-27 ENCOUNTER — Ambulatory Visit (INDEPENDENT_AMBULATORY_CARE_PROVIDER_SITE_OTHER): Payer: Managed Care, Other (non HMO) | Admitting: Family Medicine

## 2012-11-27 ENCOUNTER — Encounter: Payer: Self-pay | Admitting: Family Medicine

## 2012-11-27 VITALS — BP 130/100 | HR 113 | Temp 98.1°F | Ht 63.75 in | Wt 248.2 lb

## 2012-11-27 DIAGNOSIS — R000 Tachycardia, unspecified: Secondary | ICD-10-CM

## 2012-11-27 DIAGNOSIS — G4700 Insomnia, unspecified: Secondary | ICD-10-CM | POA: Insufficient documentation

## 2012-11-27 DIAGNOSIS — E781 Pure hyperglyceridemia: Secondary | ICD-10-CM

## 2012-11-27 DIAGNOSIS — F418 Other specified anxiety disorders: Secondary | ICD-10-CM

## 2012-11-27 DIAGNOSIS — I10 Essential (primary) hypertension: Secondary | ICD-10-CM

## 2012-11-27 DIAGNOSIS — F341 Dysthymic disorder: Secondary | ICD-10-CM

## 2012-11-27 LAB — BASIC METABOLIC PANEL
CO2: 26 mEq/L (ref 19–32)
Calcium: 9.1 mg/dL (ref 8.4–10.5)
Chloride: 100 mEq/L (ref 96–112)
Potassium: 3.6 mEq/L (ref 3.5–5.1)
Sodium: 137 mEq/L (ref 135–145)

## 2012-11-27 LAB — HEPATIC FUNCTION PANEL
Alkaline Phosphatase: 68 U/L (ref 39–117)
Bilirubin, Direct: 0 mg/dL (ref 0.0–0.3)
Total Protein: 7.7 g/dL (ref 6.0–8.3)

## 2012-11-27 LAB — LIPID PANEL: HDL: 33.5 mg/dL — ABNORMAL LOW (ref >39.00)

## 2012-11-27 MED ORDER — METOPROLOL TARTRATE 25 MG PO TABS: 25.0000 mg | ORAL_TABLET | Freq: Two times a day (BID) | ORAL | Status: DC

## 2012-11-27 MED ORDER — TRAZODONE HCL 50 MG PO TABS: 25.0000 mg | ORAL_TABLET | Freq: Every evening | ORAL | Status: DC | PRN

## 2012-11-27 NOTE — Assessment & Plan Note (Signed)
New.  Suspect this is situational due to travel.  Script for trazodone given to use as needed as pt is again flying to [facility] next week.  Will follow.

## 2012-11-27 NOTE — Assessment & Plan Note (Signed)
Chronic problem.  Pt feels sxs are currently manageable and is not interested in meds at this time.  Will continue to follow.

## 2012-11-27 NOTE — Assessment & Plan Note (Signed)
Check labs.  Start meds prn.  Will follow.

## 2012-11-27 NOTE — Assessment & Plan Note (Signed)
Chronic problem.  Not well controlled today.  Pt admits to increased stress w/ recent travelling and insomnia.  Increase metoprolol to 1 tab BID.  Will follow.

## 2012-11-27 NOTE — Assessment & Plan Note (Signed)
HR again elevated today.  Increase metoprolol to 1 tab bid.  Will follow.

## 2012-11-27 NOTE — Progress Notes (Signed)
  Subjective:    Patient ID: Kayla Combs, female    DOB: 1971/02/08, 42 y.o.   MRN: [id number]  HPI Anxiety/depression- feels that sxs are 'alright'.  Doesn't feel as 'down' as before but 'still pretty snappy'.  Doesn't feel the need for meds right now.  Has been on Prozac previously w/out notable results.  HTN- chronic problem, just took BP meds prior to appt.  1/2 tab metoprolol twice daily and HCTZ once daily.  No CP, occasional palpitations, no SOB, HAs, visual changes, edema.  Pt will occasionally take BP at home- was started on OCPs by GYN but BP did not tolerate this and stopped.  Insomnia- pt reports that while she was traveling she was unable to sleep but things have been better since being home.       Review of Systems For ROS see HPI     Objective:   Physical Exam  Vitals reviewed. Constitutional: She is oriented to person, place, and time. She appears well-developed and well-nourished. No distress.  obese  HENT:  Head: Normocephalic and atraumatic.  Eyes: Conjunctivae and EOM are normal. Pupils are equal, round, and reactive to light.  Neck: Normal range of motion. Neck supple. No thyromegaly present.  Cardiovascular: Regular rhythm, normal heart sounds and intact distal pulses.   No murmur heard. Tachy but regular  Pulmonary/Chest: Effort normal and breath sounds normal. No respiratory distress.  Abdominal: Soft. She exhibits no distension. There is no tenderness.  Musculoskeletal: She exhibits edema (trace edema of feet bilaterally). She exhibits no tenderness.  Lymphadenopathy:    She has no cervical adenopathy.  Neurological: She is alert and oriented to person, place, and time.  Skin: Skin is warm and dry.  Psychiatric: She has a normal mood and affect. Her behavior is normal.          Assessment & Plan:

## 2012-11-27 NOTE — Patient Instructions (Addendum)
Follow up in 4-6 weeks to recheck BP Increase the metoprolol to 1 tab twice daily Continue the HCTZ daily Use the trazodone as needed for sleep- start w/ 1/2 tab Call with any questions or concerns Have a great trip!!

## 2012-11-28 LAB — LDL CHOLESTEROL, DIRECT: Direct LDL: 116.8 mg/dL

## 2012-12-03 ENCOUNTER — Telehealth: Payer: Self-pay | Admitting: Family Medicine

## 2012-12-03 NOTE — Telephone Encounter (Signed)
Grettel/Patient [phone].  Office visit  11/27/12.  Called 8/8/14regarding medication issue but received no reply.  Reports Rx for HCTZ not received by [facility]. Currently out of both medications,  HCTZ and Metoprolol.    Requesting Rx for HCTZ be sent to [facility] phone [phone] and Rx for 30 day supply of both HCTZ and Metoprolol [facility] to [facility] [phone].  Please call back when Rx approved and [facility].

## 2012-12-04 ENCOUNTER — Other Ambulatory Visit: Payer: Self-pay | Admitting: *Deleted

## 2012-12-04 MED ORDER — HYDROCHLOROTHIAZIDE 12.5 MG PO TABS: 12.5000 mg | ORAL_TABLET | Freq: Every day | ORAL | Status: DC

## 2012-12-04 NOTE — Telephone Encounter (Signed)
Rx has been filled for HCTZ 12.5 MG.  Ag cma

## 2012-12-05 ENCOUNTER — Encounter: Payer: Self-pay | Admitting: *Deleted

## 2012-12-05 ENCOUNTER — Telehealth: Payer: Self-pay | Admitting: *Deleted

## 2012-12-05 DIAGNOSIS — I10 Essential (primary) hypertension: Secondary | ICD-10-CM

## 2012-12-05 NOTE — Progress Notes (Signed)
Left message for pt to return our call concerning recent labs.

## 2012-12-05 NOTE — Progress Notes (Signed)
Letter sent to pt with follow up intructions.

## 2012-12-06 ENCOUNTER — Other Ambulatory Visit: Payer: Self-pay | Admitting: *Deleted

## 2012-12-06 MED ORDER — HYDROCHLOROTHIAZIDE 12.5 MG PO TABS: 12.5000 mg | ORAL_TABLET | Freq: Every day | ORAL | Status: DC

## 2012-12-06 MED ORDER — METOPROLOL TARTRATE 25 MG PO TABS: 25.0000 mg | ORAL_TABLET | Freq: Two times a day (BID) | ORAL | Status: DC

## 2012-12-06 NOTE — Telephone Encounter (Signed)
Rf for Lopressor and HCTZ sent to [facility] order

## 2012-12-31 ENCOUNTER — Encounter: Payer: Self-pay | Admitting: Family Medicine

## 2012-12-31 ENCOUNTER — Ambulatory Visit (INDEPENDENT_AMBULATORY_CARE_PROVIDER_SITE_OTHER): Payer: Managed Care, Other (non HMO) | Admitting: Family Medicine

## 2012-12-31 VITALS — BP 128/82 | HR 87 | Temp 98.5°F | Ht 63.75 in | Wt 244.2 lb

## 2012-12-31 DIAGNOSIS — G4700 Insomnia, unspecified: Secondary | ICD-10-CM

## 2012-12-31 DIAGNOSIS — I10 Essential (primary) hypertension: Secondary | ICD-10-CM

## 2012-12-31 MED ORDER — ZOLPIDEM TARTRATE 10 MG PO TABS: 10.0000 mg | ORAL_TABLET | Freq: Every evening | ORAL | Status: DC | PRN

## 2012-12-31 NOTE — Assessment & Plan Note (Signed)
Chronic problem.  BP and pulse better controlled since increasing to 1 tab of metoprolol.  Will continue to follow.  No med changes at this time.

## 2012-12-31 NOTE — Patient Instructions (Addendum)
Schedule your complete physical for November or at your convenience Your BP looks great and so does the pulse! No changes in meds at this time Keep up the good work!  You look great! Happy Fall!

## 2012-12-31 NOTE — Progress Notes (Signed)
  Subjective:    Patient ID: Kayla Combs, female    DOB: Sep 11, 1970, 42 y.o.   MRN: [id number]  HPI HTN- chronic problem, metoprolol was increased to 1 tab BID at last visit.  Continues on HCTZ.  HR better controlled today.  Denies CP, SOB, HAs, visual changes, edema.  Insomnia- pt reports trazodone was ineffective.  Pt rarely takes med but does take meds when it has been 2-3 nights w/out sleep.   Review of Systems For ROS see HPI     Objective:   Physical Exam  Vitals reviewed. Constitutional: She is oriented to person, place, and time. She appears well-developed and well-nourished. No distress.  HENT:  Head: Normocephalic and atraumatic.  Eyes: Conjunctivae and EOM are normal. Pupils are equal, round, and reactive to light.  Neck: Normal range of motion. Neck supple. No thyromegaly present.  Cardiovascular: Normal rate, regular rhythm, normal heart sounds and intact distal pulses.   No murmur heard. Pulmonary/Chest: Effort normal and breath sounds normal. No respiratory distress.  Abdominal: Soft. She exhibits no distension. There is no tenderness.  Musculoskeletal: She exhibits no edema.  Lymphadenopathy:    She has no cervical adenopathy.  Neurological: She is alert and oriented to person, place, and time.  Skin: Skin is warm and dry.  Psychiatric: She has a normal mood and affect. Her behavior is normal.          Assessment & Plan:

## 2012-12-31 NOTE — Assessment & Plan Note (Signed)
Trazodone was ineffective for pt.  Will switch to Ambien prn.

## 2013-01-11 ENCOUNTER — Ambulatory Visit (INDEPENDENT_AMBULATORY_CARE_PROVIDER_SITE_OTHER): Payer: Managed Care, Other (non HMO) | Admitting: Family Medicine

## 2013-01-11 ENCOUNTER — Encounter: Payer: Self-pay | Admitting: Family Medicine

## 2013-01-11 VITALS — BP 140/88 | HR 105 | Temp 98.3°F | Ht 63.5 in | Wt 243.4 lb

## 2013-01-11 DIAGNOSIS — J069 Acute upper respiratory infection, unspecified: Secondary | ICD-10-CM

## 2013-01-11 MED ORDER — PROMETHAZINE-DM 6.25-15 MG/5ML PO SYRP: 5.0000 mL | ORAL_SOLUTION | Freq: Four times a day (QID) | ORAL | Status: DC | PRN

## 2013-01-11 MED ORDER — AZITHROMYCIN 250 MG PO TABS: ORAL_TABLET | ORAL | Status: DC

## 2013-01-11 NOTE — Assessment & Plan Note (Signed)
Pt's sxs and duration of illness concerning for atypical infxn.  Start Zpack.  Cough meds prn.  Reviewed supportive care and red flags that should prompt return.  Pt expressed understanding and is in agreement w/ plan.

## 2013-01-11 NOTE — Progress Notes (Signed)
  Subjective:    Patient ID: Kayla Combs, female    DOB: 01-Jul-1970, 42 y.o.   MRN: [id number]  HPI URI- pt has been feeling poorly for a few weeks.  Ears are painful, sore throat, hoarseness.  Cough is productive of dark yellow sputum, blood tinged.  Subjective fevers.  No facial pain/pressure.  + nausea.  + sick contacts.   Review of Systems For ROS see HPI     Objective:   Physical Exam  Vitals reviewed. Constitutional: Kayla Combs appears well-developed and well-nourished. No distress.  HENT:  Head: Normocephalic and atraumatic.  TMs normal bilaterally Mild nasal congestion Throat w/out erythema, edema, or exudate  Eyes: Conjunctivae and EOM are normal. Pupils are equal, round, and reactive to light.  Neck: Normal range of motion. Neck supple.  Cardiovascular: Normal rate, regular rhythm, normal heart sounds and intact distal pulses.   No murmur heard. Pulmonary/Chest: Effort normal and breath sounds normal. No respiratory distress. Kayla Combs has no wheezes.  + hacking cough  Lymphadenopathy:    Kayla Combs has no cervical adenopathy.          Assessment & Plan:

## 2013-01-11 NOTE — Patient Instructions (Addendum)
This has gone on too long Start the Zpack today as directed Drink plenty of fluids Cough syrup as needed- will cause drowsiness If unable to be drowsy- Mucinex DM REST!!! Hang in there!!!

## 2013-03-06 ENCOUNTER — Telehealth: Payer: Self-pay

## 2013-03-06 NOTE — Telephone Encounter (Addendum)
Left message for call back  identifiable Medication and allergies: reviewed and updated  90 day supply/mail order: na [facility] pharmacy[facility]   Immunizations due:  Admin flu vaccine upon arrival  A/P:   No changes to FH or PSH GYN [facility], ENT [employer], [employer]. Podiatry- [facility]--07/2012--neg  To Discuss with Provider: Not at this time

## 2013-03-08 ENCOUNTER — Encounter: Payer: Managed Care, Other (non HMO) | Admitting: Family Medicine

## 2013-04-02 ENCOUNTER — Ambulatory Visit (INDEPENDENT_AMBULATORY_CARE_PROVIDER_SITE_OTHER): Payer: Managed Care, Other (non HMO) | Admitting: Family Medicine

## 2013-04-02 ENCOUNTER — Encounter: Payer: Self-pay | Admitting: Family Medicine

## 2013-04-02 VITALS — BP 130/90 | HR 104 | Temp 98.6°F | Ht 63.5 in | Wt 244.2 lb

## 2013-04-02 DIAGNOSIS — Z0000 Encounter for general adult medical examination without abnormal findings: Secondary | ICD-10-CM

## 2013-04-02 DIAGNOSIS — Z23 Encounter for immunization: Secondary | ICD-10-CM

## 2013-04-02 LAB — CBC WITH DIFFERENTIAL/PLATELET
Basophils Absolute: 0.1 10*3/uL (ref 0.0–0.1)
Hemoglobin: 14.7 g/dL (ref 12.0–15.0)
Lymphocytes Relative: 19 % (ref 12.0–46.0)
Monocytes Relative: 5.5 % (ref 3.0–12.0)
Neutro Abs: 9.6 10*3/uL — ABNORMAL HIGH (ref 1.4–7.7)
Platelets: 449 10*3/uL — ABNORMAL HIGH (ref 150.0–400.0)
RDW: 12.9 % (ref 11.5–14.6)

## 2013-04-02 LAB — LIPID PANEL: Total CHOL/HDL Ratio: 6

## 2013-04-02 LAB — LDL CHOLESTEROL, DIRECT: Direct LDL: 139.6 mg/dL

## 2013-04-02 LAB — BASIC METABOLIC PANEL
CO2: 26 mEq/L (ref 19–32)
Chloride: 99 mEq/L (ref 96–112)
Potassium: 3.6 mEq/L (ref 3.5–5.1)
Sodium: 134 mEq/L — ABNORMAL LOW (ref 135–145)

## 2013-04-02 LAB — HEPATIC FUNCTION PANEL
ALT: 36 U/L — ABNORMAL HIGH (ref 0–35)
AST: 23 U/L (ref 0–37)
Alkaline Phosphatase: 66 U/L (ref 39–117)
Bilirubin, Direct: 0 mg/dL (ref 0.0–0.3)
Total Protein: 8.1 g/dL (ref 6.0–8.3)

## 2013-04-02 NOTE — Patient Instructions (Signed)
Follow up in 6 months to recheck BP We'll notify you of your lab results and make any changes if needed Try and make healthy food choices and get regular exercise Call with any questions or concerns Happy Holidays!!! 

## 2013-04-02 NOTE — Progress Notes (Signed)
   Subjective:    Patient ID: Kayla Combs, female    DOB: 06-21-1970, 42 y.o.   MRN: [id number]  HPI Pre visit review using our clinic review tool, if applicable. No additional management support is needed unless otherwise documented below in the visit note.  CPE- UTD on GYN.   Review of Systems Patient reports no vision/ hearing changes, adenopathy,fever, weight change,  persistant/recurrent hoarseness , swallowing issues, chest pain, palpitations, edema, persistant/recurrent cough, hemoptysis, dyspnea (rest/exertional/paroxysmal nocturnal), gastrointestinal bleeding (melena, rectal bleeding), abdominal pain, significant heartburn, bowel changes, GU symptoms (dysuria, hematuria, incontinence), Gyn symptoms (abnormal  bleeding, pain),  syncope, focal weakness, memory loss, numbness & tingling, skin/hair/nail changes, abnormal bruising or bleeding, anxiety, or depression.     Objective:   Physical Exam General Appearance:    Alert, cooperative, no distress, appears stated age, obese  Head:    Normocephalic, without obvious abnormality, atraumatic  Eyes:    PERRL, conjunctiva/corneas clear, EOM's intact, fundi    benign, both eyes  Ears:    Normal TM's and external ear canals, both ears  Nose:   Nares normal, septum midline, mucosa normal, no drainage    or sinus tenderness  Throat:   Lips, mucosa, and tongue normal; teeth and gums normal  Neck:   Supple, symmetrical, trachea midline, no adenopathy;    Thyroid: no enlargement/tenderness/nodules  Back:     Symmetric, no curvature, ROM normal, no CVA tenderness  Lungs:     Clear to auscultation bilaterally, respirations unlabored  Chest Wall:    No tenderness or deformity   Heart:    Regular rate and rhythm, S1 and S2 normal, no murmur, rub   or gallop  Breast Exam:    Deferred to GYN  Abdomen:     Soft, non-tender, bowel sounds active all four quadrants,    no masses, no organomegaly  Genitalia:    Deferred to GYN  Rectal:     Extremities:   Extremities normal, atraumatic, no cyanosis or edema  Pulses:   2+ and symmetric all extremities  Skin:   Skin color, texture, turgor normal, no rashes or lesions  Lymph nodes:   Cervical, supraclavicular, and axillary nodes normal  Neurologic:   CNII-XII intact, normal strength, sensation and reflexes    throughout          Assessment & Plan:

## 2013-04-02 NOTE — Assessment & Plan Note (Signed)
Encouraged regular, aerobic activity for 30 minutes at least 4x/week and monitoring caloric intake w/ the help of My[employer]itnessPal app.  

## 2013-04-02 NOTE — Assessment & Plan Note (Signed)
Pt's PE WNL w/ exception of obesity.  UTD on GYN.  Check labs.  Anticipatory guidance provided.  

## 2013-04-02 NOTE — Addendum Note (Signed)
Addended by: Verdie Shire on: 04/02/2013 10:59 AM   Modules accepted: Orders, SmartSet

## 2013-04-02 NOTE — Progress Notes (Signed)
Pre visit review using our clinic review tool, if applicable. No additional management support is needed unless otherwise documented below in the visit note. 

## 2013-04-03 ENCOUNTER — Encounter: Payer: Self-pay | Admitting: *Deleted

## 2013-04-03 LAB — HM MAMMOGRAPHY: HM MAMMO: NORMAL

## 2013-04-05 LAB — VITAMIN D 1,25 DIHYDROXY
Vitamin D 1, 25 (OH)2 Total: 22 pg/mL (ref 18–72)
Vitamin D2 1, 25 (OH)2: <8 pg/mL

## 2013-04-08 ENCOUNTER — Encounter: Payer: Self-pay | Admitting: General Practice

## 2013-06-04 ENCOUNTER — Other Ambulatory Visit: Payer: Self-pay | Admitting: Family Medicine

## 2013-06-04 NOTE — Telephone Encounter (Signed)
Med filled.  

## 2013-09-11 ENCOUNTER — Encounter: Payer: Self-pay | Admitting: Family Medicine

## 2013-09-11 ENCOUNTER — Telehealth: Payer: Self-pay | Admitting: Family Medicine

## 2013-09-11 ENCOUNTER — Ambulatory Visit (INDEPENDENT_AMBULATORY_CARE_PROVIDER_SITE_OTHER): Payer: Managed Care, Other (non HMO) | Admitting: Family Medicine

## 2013-09-11 VITALS — BP 110/80 | HR 95 | Temp 98.2°F | Resp 16 | Wt 220.5 lb

## 2013-09-11 DIAGNOSIS — Z23 Encounter for immunization: Secondary | ICD-10-CM

## 2013-09-11 DIAGNOSIS — I10 Essential (primary) hypertension: Secondary | ICD-10-CM

## 2013-09-11 DIAGNOSIS — E781 Pure hyperglyceridemia: Secondary | ICD-10-CM

## 2013-09-11 LAB — BASIC METABOLIC PANEL
BUN: 12 mg/dL (ref 6–23)
CHLORIDE: 100 meq/L (ref 96–112)
CO2: 30 mEq/L (ref 19–32)
Calcium: 9.8 mg/dL (ref 8.4–10.5)
Creatinine, Ser: 0.7 mg/dL (ref 0.4–1.2)
GFR: 103.18 mL/min (ref >60.00)
Glucose, Bld: 101 mg/dL — ABNORMAL HIGH (ref 70–99)
POTASSIUM: 4.2 meq/L (ref 3.5–5.1)
SODIUM: 138 meq/L (ref 135–145)

## 2013-09-11 LAB — HEPATIC FUNCTION PANEL
ALK PHOS: 62 U/L (ref 39–117)
ALT: 27 U/L (ref 0–35)
AST: 26 U/L (ref 0–37)
Albumin: 3.9 g/dL (ref 3.5–5.2)
BILIRUBIN TOTAL: 0.6 mg/dL (ref 0.2–1.2)
Bilirubin, Direct: 0 mg/dL (ref 0.0–0.3)
Total Protein: 7.3 g/dL (ref 6.0–8.3)

## 2013-09-11 LAB — LIPID PANEL
CHOLESTEROL: 179 mg/dL (ref 0–200)
HDL: 34.1 mg/dL — ABNORMAL LOW (ref >39.00)
LDL Cholesterol: 107 mg/dL — ABNORMAL HIGH (ref 0–99)
Total CHOL/HDL Ratio: 5
Triglycerides: 191 mg/dL — ABNORMAL HIGH (ref 0.0–149.0)
VLDL: 38.2 mg/dL (ref 0.0–40.0)

## 2013-09-11 MED ORDER — METOPROLOL TARTRATE 25 MG PO TABS: ORAL_TABLET | ORAL | Status: DC

## 2013-09-11 NOTE — Addendum Note (Signed)
Addended by: Sheliah HatchABORI, Vilas Edgerly E on: 09/11/2013 09:55 AM   Modules accepted: Orders

## 2013-09-11 NOTE — Assessment & Plan Note (Signed)
Chronic problem, BP is excellent!  Applauded her recent efforts on diet and exercise- resulting in 35 lb weight loss!  Will stop HCTZ.  Continue Metoprolol twice daily (more for HR control than BP control).  Will follow.

## 2013-09-11 NOTE — Telephone Encounter (Signed)
Relevant patient education mailed to patient.  

## 2013-09-11 NOTE — Assessment & Plan Note (Signed)
Chronic problem.  Pt has made total lifestyle overhaul and lost 35 lbs!  Applauded her efforts.  Will continue to follow.

## 2013-09-11 NOTE — Progress Notes (Signed)
   Subjective:    Patient ID: Kayla Combs, female    DOB: [birth year], 43 y.o.   MRN: [id number]  HPI Obesity- pt has lost 35 lbs!!!  Working out regularly  HTN- chronic problem, excellent control today on HCTZ and metoprolol.  No CP, SOB, HAs, visual changes, edema.  PVCs are much more infrequent.  Hypertriglyceridemia- chronic problem, attempting to control w/ healthy diet and regular exercise.  Has lost 35 lbs!   Review of Systems For ROS see HPI     Objective:   Physical Exam  Vitals reviewed. Constitutional: She is oriented to person, place, and time. She appears well-developed and well-nourished. No distress.  HENT:  Head: Normocephalic and atraumatic.  Eyes: Conjunctivae and EOM are normal. Pupils are equal, round, and reactive to light.  Neck: Normal range of motion. Neck supple. No thyromegaly present.  Cardiovascular: Normal rate, regular rhythm, normal heart sounds and intact distal pulses.   No murmur heard. Pulmonary/Chest: Effort normal and breath sounds normal. No respiratory distress.  Abdominal: Soft. She exhibits no distension. There is no tenderness.  Musculoskeletal: She exhibits no edema.  Lymphadenopathy:    She has no cervical adenopathy.  Neurological: She is alert and oriented to person, place, and time.  Skin: Skin is warm and dry.  Psychiatric: She has a normal mood and affect. Her behavior is normal.          Assessment & Plan:

## 2013-09-11 NOTE — Assessment & Plan Note (Signed)
Chronic problem.  Pt has lost 35 lbs!  Pt has eaten this AM (shredded wheat, almond milk, coffee) but is interested in having labs done.  Will follow.

## 2013-09-11 NOTE — Patient Instructions (Signed)
Schedule your complete physical for 6 months We'll notify you of your lab results and make any changes if needed STOP the HCTZ Continue the metoprolol Keep up the good work!  You look great! Happy Memorial Day!!

## 2013-09-11 NOTE — Progress Notes (Signed)
Pre visit review using our clinic review tool, if applicable. No additional management support is needed unless otherwise documented below in the visit note. 

## 2013-09-11 NOTE — Addendum Note (Signed)
Addended by: Jackson LatinoYLER, JESSICA L on: 09/11/2013 09:45 AM   Modules accepted: Orders

## 2013-09-26 ENCOUNTER — Ambulatory Visit: Payer: Managed Care, Other (non HMO) | Admitting: Family Medicine

## 2014-03-20 ENCOUNTER — Encounter: Payer: Self-pay | Admitting: Family Medicine

## 2014-03-21 MED ORDER — METOPROLOL TARTRATE 25 MG PO TABS: ORAL_TABLET | ORAL | Status: DC

## 2014-03-21 NOTE — Telephone Encounter (Signed)
Med filled.  

## 2014-03-31 ENCOUNTER — Encounter: Payer: Self-pay | Admitting: Family Medicine

## 2014-03-31 MED ORDER — METOPROLOL TARTRATE 25 MG PO TABS: ORAL_TABLET | ORAL | Status: DC

## 2014-04-03 ENCOUNTER — Ambulatory Visit (INDEPENDENT_AMBULATORY_CARE_PROVIDER_SITE_OTHER): Payer: Managed Care, Other (non HMO) | Admitting: General Practice

## 2014-04-03 ENCOUNTER — Other Ambulatory Visit (HOSPITAL_COMMUNITY)
Admission: RE | Admit: 2014-04-03 | Discharge: 2014-04-03 | Disposition: A | Discharge status: Home / Self Care | Payer: Managed Care, Other (non HMO) | Source: Ambulatory Visit | Attending: Family Medicine | Admitting: Family Medicine

## 2014-04-03 ENCOUNTER — Ambulatory Visit (INDEPENDENT_AMBULATORY_CARE_PROVIDER_SITE_OTHER): Payer: Managed Care, Other (non HMO) | Admitting: Family Medicine

## 2014-04-03 ENCOUNTER — Encounter: Payer: Self-pay | Admitting: Family Medicine

## 2014-04-03 VITALS — BP 112/78 | HR 89 | Temp 98.4°F | Resp 16 | Ht 63.5 in | Wt 231.0 lb

## 2014-04-03 DIAGNOSIS — Z1151 Encounter for screening for human papillomavirus (HPV): Secondary | ICD-10-CM | POA: Insufficient documentation

## 2014-04-03 DIAGNOSIS — Z0000 Encounter for general adult medical examination without abnormal findings: Secondary | ICD-10-CM

## 2014-04-03 DIAGNOSIS — N841 Polyp of cervix uteri: Secondary | ICD-10-CM | POA: Insufficient documentation

## 2014-04-03 DIAGNOSIS — Z01419 Encounter for gynecological examination (general) (routine) without abnormal findings: Secondary | ICD-10-CM | POA: Diagnosis present

## 2014-04-03 DIAGNOSIS — Z124 Encounter for screening for malignant neoplasm of cervix: Secondary | ICD-10-CM | POA: Insufficient documentation

## 2014-04-03 DIAGNOSIS — Z23 Encounter for immunization: Secondary | ICD-10-CM

## 2014-04-03 LAB — CBC WITH DIFFERENTIAL/PLATELET
BASOS PCT: 0.4 % (ref 0.0–3.0)
Basophils Absolute: 0 10*3/uL (ref 0.0–0.1)
EOS ABS: 0.4 10*3/uL (ref 0.0–0.7)
Eosinophils Relative: 3.9 % (ref 0.0–5.0)
HCT: 42.6 % (ref 36.0–46.0)
Hemoglobin: 14.3 g/dL (ref 12.0–15.0)
LYMPHS PCT: 23.1 % (ref 12.0–46.0)
Lymphs Abs: 2.5 10*3/uL (ref 0.7–4.0)
MCHC: 33.5 g/dL (ref 30.0–36.0)
MCV: 85.7 fl (ref 78.0–100.0)
Monocytes Absolute: 0.7 10*3/uL (ref 0.1–1.0)
Monocytes Relative: 6.7 % (ref 3.0–12.0)
Neutro Abs: 7.1 10*3/uL (ref 1.4–7.7)
Neutrophils Relative %: 65.9 % (ref 43.0–77.0)
PLATELETS: 380 10*3/uL (ref 150.0–400.0)
RBC: 4.98 Mil/uL (ref 3.87–5.11)
RDW: 13.1 % (ref 11.5–15.5)
WBC: 10.8 10*3/uL — ABNORMAL HIGH (ref 4.0–10.5)

## 2014-04-03 LAB — BASIC METABOLIC PANEL
BUN: 11 mg/dL (ref 6–23)
CALCIUM: 9.2 mg/dL (ref 8.4–10.5)
CHLORIDE: 103 meq/L (ref 96–112)
CO2: 27 meq/L (ref 19–32)
Creatinine, Ser: 0.6 mg/dL (ref 0.4–1.2)
GFR: 121.89 mL/min (ref >60.00)
GLUCOSE: 102 mg/dL — AB (ref 70–99)
Potassium: 4.5 mEq/L (ref 3.5–5.1)
SODIUM: 137 meq/L (ref 135–145)

## 2014-04-03 LAB — LIPID PANEL
CHOLESTEROL: 209 mg/dL — AB (ref 0–200)
HDL: 38.2 mg/dL — AB (ref >39.00)
LDL Cholesterol: 139 mg/dL — ABNORMAL HIGH (ref 0–99)
NonHDL: 170.8
Total CHOL/HDL Ratio: 5
Triglycerides: 158 mg/dL — ABNORMAL HIGH (ref 0.0–149.0)
VLDL: 31.6 mg/dL (ref 0.0–40.0)

## 2014-04-03 LAB — HEPATIC FUNCTION PANEL
ALT: 23 U/L (ref 0–35)
AST: 20 U/L (ref 0–37)
Albumin: 4.2 g/dL (ref 3.5–5.2)
Alkaline Phosphatase: 70 U/L (ref 39–117)
Bilirubin, Direct: 0.1 mg/dL (ref 0.0–0.3)
TOTAL PROTEIN: 7.2 g/dL (ref 6.0–8.3)
Total Bilirubin: 0.9 mg/dL (ref 0.2–1.2)

## 2014-04-03 LAB — VITAMIN D 25 HYDROXY (VIT D DEFICIENCY, FRACTURES): VITD: 28.95 ng/mL — ABNORMAL LOW (ref 30.00–100.00)

## 2014-04-03 LAB — TSH: TSH: 1.38 u[IU]/mL (ref 0.35–4.50)

## 2014-04-03 NOTE — Patient Instructions (Signed)
Follow up in 6 months to recheck BP and cholesterol We'll notify you of your lab results and make any changes if needed Continue to make healthy food choices and get regular exercise You have a small cervical polyp- please call GYN to have this evaluated Call with any questions or concerns Happy Holidays!!!

## 2014-04-03 NOTE — Progress Notes (Signed)
Pre visit review using our clinic review tool, if applicable. No additional management support is needed unless otherwise documented below in the visit note. 

## 2014-04-03 NOTE — Assessment & Plan Note (Signed)
Pt's PE WNL w/ exception of cervical polyp and obesity.  Check labs.  Encouraged healthy diet and regular exercise.  Anticipatory guidance provided.

## 2014-04-03 NOTE — Assessment & Plan Note (Signed)
New.  Pt was unaware that this is present and likely is new for her.  Encouraged her to f/u w/ GYN.  Pt to schedule appt.

## 2014-04-03 NOTE — Assessment & Plan Note (Signed)
Pap collected. 

## 2014-04-03 NOTE — Progress Notes (Signed)
   Subjective:    Patient ID: Kayla Combs, female    DOB: Dec 02, 1970, 43 y.o.   MRN: [id number]  HPI CPE- pt is noticing menstrual cycle is shorter (every 24 days rather than every 28).  Periods are heavier, w/ 'lots of clots'.  UTD on mammo   Review of Systems Patient reports no vision/ hearing changes, adenopathy,fever, weight change,  persistant/recurrent hoarseness , swallowing issues, chest pain, palpitations, edema, persistant/recurrent cough, hemoptysis, dyspnea (rest/exertional/paroxysmal nocturnal), gastrointestinal bleeding (melena, rectal bleeding), abdominal pain, significant heartburn, bowel changes, GU symptoms (dysuria, hematuria, incontinence),  syncope, focal weakness, memory loss, numbness & tingling, skin/hair/nail changes, abnormal bruising or bleeding, anxiety, or depression.     Objective:   Physical Exam  General Appearance:    Alert, cooperative, no distress, appears stated age, obese  Head:    Normocephalic, without obvious abnormality, atraumatic  Eyes:    PERRL, conjunctiva/corneas clear, EOM's intact, fundi    benign, both eyes  Ears:    Normal TM's and external ear canals, both ears  Nose:   Nares normal, septum midline, mucosa normal, no drainage    or sinus tenderness  Throat:   Lips, mucosa, and tongue normal; teeth and gums normal  Neck:   Supple, symmetrical, trachea midline, no adenopathy;    Thyroid: no enlargement/tenderness/nodules  Back:     Symmetric, no curvature, ROM normal, no CVA tenderness  Lungs:     Clear to auscultation bilaterally, respirations unlabored  Chest Wall:    No tenderness or deformity   Heart:    Regular rate and rhythm, S1 and S2 normal, no murmur, rub   or gallop  Breast Exam:    No tenderness, masses, or nipple abnormality  Abdomen:     Soft, non-tender, bowel sounds active all four quadrants,    no masses, no organomegaly  Genitalia:    External genitalia normal, cervix normal in appearance w/ exception of  small polyp, no CMT, uterus in normal size and position, adnexa w/out mass or tenderness, mucosa pink and moist, no lesions or discharge present  Rectal:    Normal external appearance  Extremities:   Extremities normal, atraumatic, no cyanosis or edema  Pulses:   2+ and symmetric all extremities  Skin:   Skin color, texture, turgor normal, no rashes or lesions  Lymph nodes:   Cervical, supraclavicular, and axillary nodes normal  Neurologic:   CNII-XII intact, normal strength, sensation and reflexes    throughout          Assessment & Plan:

## 2014-04-07 LAB — CYTOLOGY - PAP

## 2014-06-11 ENCOUNTER — Emergency Department (HOSPITAL_BASED_OUTPATIENT_CLINIC_OR_DEPARTMENT_OTHER)
Admission: EM | Admit: 2014-06-11 | Discharge: 2014-06-12 | Discharge status: Against Medical Advice | Payer: Managed Care, Other (non HMO) | Attending: Emergency Medicine | Admitting: Emergency Medicine

## 2014-06-11 ENCOUNTER — Encounter (HOSPITAL_BASED_OUTPATIENT_CLINIC_OR_DEPARTMENT_OTHER): Payer: Self-pay

## 2014-06-11 DIAGNOSIS — T23252A Burn of second degree of left palm, initial encounter: Secondary | ICD-10-CM | POA: Diagnosis present

## 2014-06-11 DIAGNOSIS — I10 Essential (primary) hypertension: Secondary | ICD-10-CM | POA: Diagnosis not present

## 2014-06-11 DIAGNOSIS — Y9389 Activity, other specified: Secondary | ICD-10-CM | POA: Insufficient documentation

## 2014-06-11 DIAGNOSIS — Y9289 Other specified places as the place of occurrence of the external cause: Secondary | ICD-10-CM | POA: Diagnosis not present

## 2014-06-11 DIAGNOSIS — Y998 Other external cause status: Secondary | ICD-10-CM | POA: Diagnosis not present

## 2014-06-11 DIAGNOSIS — X153XXA Contact with hot saucepan or skillet, initial encounter: Secondary | ICD-10-CM | POA: Insufficient documentation

## 2014-06-11 NOTE — ED Notes (Signed)
Per registration pt left

## 2014-06-11 NOTE — ED Notes (Signed)
Burned left palm approx 630pm on hot pan handle-blister noted

## 2014-08-06 ENCOUNTER — Ambulatory Visit (INDEPENDENT_AMBULATORY_CARE_PROVIDER_SITE_OTHER): Payer: Managed Care, Other (non HMO) | Admitting: Family Medicine

## 2014-08-06 ENCOUNTER — Encounter: Payer: Self-pay | Admitting: Family Medicine

## 2014-08-06 VITALS — BP 130/82 | HR 87 | Temp 98.0°F | Resp 16 | Wt 221.5 lb

## 2014-08-06 DIAGNOSIS — M25511 Pain in right shoulder: Secondary | ICD-10-CM

## 2014-08-06 MED ORDER — CYCLOBENZAPRINE HCL 10 MG PO TABS: 10.0000 mg | ORAL_TABLET | Freq: Every day | ORAL | Status: DC

## 2014-08-06 MED ORDER — MELOXICAM 15 MG PO TABS: 15.0000 mg | ORAL_TABLET | Freq: Every day | ORAL | Status: DC

## 2014-08-06 NOTE — Assessment & Plan Note (Signed)
New to provider, ongoing for pt x6 months.  Pain is now constant.  + impingement signs and possible cuff pathology.  Start flexeril for night, mobic daily.  Refer to shoulder specialist for complete evaluation and tx.  Pt expressed understanding and is in agreement w/ plan.

## 2014-08-06 NOTE — Patient Instructions (Signed)
Follow up as needed We'll call you with your ortho appt Start the Mobic once daily- w/ food- for the inflammation Flexeril nightly for muscle spasm Call with any questions or concerns Hang in there!!!

## 2014-08-06 NOTE — Progress Notes (Signed)
   Subjective:    Patient ID: Kayla Combs, female    DOB: 1970/06/03, 44 y.o.   MRN: [id number]  HPI Shoulder pain- R shoulder, 'i just can't take it anymore'.  Had a trainer and was working out 3x/week and developed shoulder pain.  Got new trainer in October and increased exercise to 5x/week.  No longer doing any shoulder work x3 weeks.  Pain is now constant.  Unable to sleep on R side.  Pain radiates down center of humerus to mid arm.  R arm is weaker, difficulty w/ overhead motion.  Driving is painful, 'it aches'.   Review of Systems For ROS see HPI     Objective:   Physical Exam  Constitutional: She is oriented to person, place, and time. She appears well-developed and well-nourished. No distress.  Cardiovascular: Intact distal pulses.   Musculoskeletal: She exhibits tenderness (R shoulder). She exhibits no edema.  + impingement signs of R shoulder Pain w/ overhead forward flexion, abduction Pain w/ internal rotation  Neurological: She is alert and oriented to person, place, and time. She has normal reflexes. No cranial nerve deficit. Coordination normal.  Skin: Skin is warm and dry.  Psychiatric: She has a normal mood and affect. Her behavior is normal. Thought content normal.  Vitals reviewed.         Assessment & Plan:

## 2014-08-06 NOTE — Progress Notes (Signed)
Pre visit review using our clinic review tool, if applicable. No additional management support is needed unless otherwise documented below in the visit note. 

## 2014-09-24 ENCOUNTER — Ambulatory Visit (INDEPENDENT_AMBULATORY_CARE_PROVIDER_SITE_OTHER): Payer: Managed Care, Other (non HMO) | Admitting: Family Medicine

## 2014-09-24 ENCOUNTER — Other Ambulatory Visit: Payer: Self-pay | Admitting: Family Medicine

## 2014-09-24 ENCOUNTER — Encounter: Payer: Self-pay | Admitting: Family Medicine

## 2014-09-24 VITALS — BP 120/78 | HR 87 | Temp 98.1°F | Resp 16 | Wt 222.4 lb

## 2014-09-24 DIAGNOSIS — D72829 Elevated white blood cell count, unspecified: Secondary | ICD-10-CM

## 2014-09-24 DIAGNOSIS — E781 Pure hyperglyceridemia: Secondary | ICD-10-CM | POA: Diagnosis not present

## 2014-09-24 DIAGNOSIS — I10 Essential (primary) hypertension: Secondary | ICD-10-CM | POA: Diagnosis not present

## 2014-09-24 LAB — CBC WITH DIFFERENTIAL/PLATELET
Basophils Absolute: 0.1 10*3/uL (ref 0.0–0.1)
Basophils Relative: 0.5 % (ref 0.0–3.0)
Eosinophils Absolute: 0.5 10*3/uL (ref 0.0–0.7)
Eosinophils Relative: 4.1 % (ref 0.0–5.0)
HCT: 42.8 % (ref 36.0–46.0)
Hemoglobin: 14.6 g/dL (ref 12.0–15.0)
LYMPHS ABS: 2.5 10*3/uL (ref 0.7–4.0)
Lymphocytes Relative: 20.4 % (ref 12.0–46.0)
MCHC: 34.1 g/dL (ref 30.0–36.0)
MCV: 85.1 fl (ref 78.0–100.0)
MONO ABS: 0.9 10*3/uL (ref 0.1–1.0)
Monocytes Relative: 6.9 % (ref 3.0–12.0)
NEUTROS PCT: 68.1 % (ref 43.0–77.0)
Neutro Abs: 8.5 10*3/uL — ABNORMAL HIGH (ref 1.4–7.7)
PLATELETS: 368 10*3/uL (ref 150.0–400.0)
RBC: 5.03 Mil/uL (ref 3.87–5.11)
RDW: 13 % (ref 11.5–15.5)
WBC: 12.5 10*3/uL — ABNORMAL HIGH (ref 4.0–10.5)

## 2014-09-24 LAB — BASIC METABOLIC PANEL
BUN: 14 mg/dL (ref 6–23)
CO2: 29 mEq/L (ref 19–32)
Calcium: 9.2 mg/dL (ref 8.4–10.5)
Chloride: 104 mEq/L (ref 96–112)
Creatinine, Ser: 0.67 mg/dL (ref 0.40–1.20)
GFR: 100.93 mL/min (ref >60.00)
Glucose, Bld: 95 mg/dL (ref 70–99)
Potassium: 4.2 mEq/L (ref 3.5–5.1)
Sodium: 137 mEq/L (ref 135–145)

## 2014-09-24 LAB — LIPID PANEL
CHOL/HDL RATIO: 4
CHOLESTEROL: 201 mg/dL — AB (ref 0–200)
HDL: 49.9 mg/dL (ref >39.00)
LDL Cholesterol: 133 mg/dL — ABNORMAL HIGH (ref 0–99)
NONHDL: 151.1
Triglycerides: 93 mg/dL (ref 0.0–149.0)
VLDL: 18.6 mg/dL (ref 0.0–40.0)

## 2014-09-24 LAB — HEPATIC FUNCTION PANEL
ALBUMIN: 4.5 g/dL (ref 3.5–5.2)
ALT: 18 U/L (ref 0–35)
AST: 18 U/L (ref 0–37)
Alkaline Phosphatase: 62 U/L (ref 39–117)
BILIRUBIN DIRECT: 0.1 mg/dL (ref 0.0–0.3)
TOTAL PROTEIN: 7.5 g/dL (ref 6.0–8.3)
Total Bilirubin: 0.6 mg/dL (ref 0.2–1.2)

## 2014-09-24 MED ORDER — METOPROLOL TARTRATE 25 MG PO TABS: ORAL_TABLET | ORAL | Status: DC

## 2014-09-24 NOTE — Progress Notes (Signed)
   Subjective:    Patient ID: Kayla Combs, female    DOB: 08-Feb-1971, 44 y.o.   MRN: [id number]  HPI HTN- chronic problem, well controlled on metoprolol.  Exercising regularly.  No CP, SOB, rare PVCs.  No HAs, visual changes, edema.  Hypertriglyceridemia- attempting to improve triglycerides w/ regular exercise and healthy eating.  Due for labs today.   Review of Systems For ROS see HPI     Objective:   Physical Exam  Constitutional: She is oriented to person, place, and time. She appears well-developed and well-nourished. No distress.  HENT:  Head: Normocephalic and atraumatic.  Eyes: Conjunctivae and EOM are normal. Pupils are equal, round, and reactive to light.  Neck: Normal range of motion. Neck supple. No thyromegaly present.  Cardiovascular: Normal rate, regular rhythm, normal heart sounds and intact distal pulses.   No murmur heard. Pulmonary/Chest: Effort normal and breath sounds normal. No respiratory distress.  Abdominal: Soft. She exhibits no distension. There is no tenderness.  Musculoskeletal: She exhibits no edema.  Lymphadenopathy:    She has no cervical adenopathy.  Neurological: She is alert and oriented to person, place, and time.  Skin: Skin is warm and dry.  Psychiatric: She has a normal mood and affect. Her behavior is normal.  Vitals reviewed.         Assessment & Plan:

## 2014-09-24 NOTE — Progress Notes (Signed)
Pre visit review using our clinic review tool, if applicable. No additional management support is needed unless otherwise documented below in the visit note. 

## 2014-09-24 NOTE — Patient Instructions (Signed)
Schedule your complete physical in 6 months We'll notify you of your lab results and make any changes if needed Keep up the good work on healthy diet and regular exercise- you're doing great! Call with any questions or concerns Have a great trip!!!

## 2014-09-24 NOTE — Assessment & Plan Note (Signed)
Chronic problem.  Well controlled today.  Asymptomatic.  Check labs.  No anticipated med changes. 

## 2014-09-24 NOTE — Assessment & Plan Note (Signed)
Pt is attempting to control w/ healthy diet and regular exercise.  Repeat labs today.  Start meds prn.

## 2014-10-01 ENCOUNTER — Other Ambulatory Visit (INDEPENDENT_AMBULATORY_CARE_PROVIDER_SITE_OTHER): Payer: Managed Care, Other (non HMO)

## 2014-10-01 DIAGNOSIS — D72829 Elevated white blood cell count, unspecified: Secondary | ICD-10-CM | POA: Diagnosis not present

## 2014-10-01 LAB — CBC WITH DIFFERENTIAL/PLATELET
Basophils Absolute: 0 10*3/uL (ref 0.0–0.1)
Basophils Relative: 0.2 % (ref 0.0–3.0)
EOS PCT: 4.7 % (ref 0.0–5.0)
Eosinophils Absolute: 0.5 10*3/uL (ref 0.0–0.7)
HCT: 42.7 % (ref 36.0–46.0)
Hemoglobin: 14.6 g/dL (ref 12.0–15.0)
LYMPHS PCT: 25.7 % (ref 12.0–46.0)
Lymphs Abs: 2.5 10*3/uL (ref 0.7–4.0)
MCHC: 34.1 g/dL (ref 30.0–36.0)
MCV: 85.8 fl (ref 78.0–100.0)
Monocytes Absolute: 0.6 10*3/uL (ref 0.1–1.0)
Monocytes Relative: 6.6 % (ref 3.0–12.0)
Neutro Abs: 6.1 10*3/uL (ref 1.4–7.7)
Neutrophils Relative %: 62.8 % (ref 43.0–77.0)
Platelets: 376 10*3/uL (ref 150.0–400.0)
RBC: 4.97 Mil/uL (ref 3.87–5.11)
RDW: 13.3 % (ref 11.5–15.5)
WBC: 9.8 10*3/uL (ref 4.0–10.5)

## 2014-10-03 ENCOUNTER — Ambulatory Visit: Payer: Managed Care, Other (non HMO) | Admitting: Family Medicine

## 2015-01-05 ENCOUNTER — Ambulatory Visit (INDEPENDENT_AMBULATORY_CARE_PROVIDER_SITE_OTHER): Payer: Managed Care, Other (non HMO) | Admitting: Family

## 2015-01-05 ENCOUNTER — Encounter: Payer: Self-pay | Admitting: Family

## 2015-01-05 ENCOUNTER — Telehealth: Payer: Self-pay | Admitting: Family

## 2015-01-05 VITALS — BP 150/90 | HR 124 | Temp 98.6°F | Resp 16 | Ht 63.5 in | Wt 235.0 lb

## 2015-01-05 DIAGNOSIS — R000 Tachycardia, unspecified: Secondary | ICD-10-CM | POA: Diagnosis not present

## 2015-01-05 DIAGNOSIS — F411 Generalized anxiety disorder: Secondary | ICD-10-CM

## 2015-01-05 DIAGNOSIS — I10 Essential (primary) hypertension: Secondary | ICD-10-CM | POA: Diagnosis not present

## 2015-01-05 LAB — D-DIMER, QUANTITATIVE: D-Dimer, Quant: 0.33 ug/mL-FEU (ref 0.00–0.48)

## 2015-01-05 MED ORDER — FLUOXETINE HCL 20 MG PO TABS: ORAL_TABLET | ORAL | Status: DC

## 2015-01-05 NOTE — Patient Instructions (Signed)
Please complete lab work prior to leaving. Increase metoprolol from 25 mg twice daily to  twice daily. Follow up with Dr. Beverely Low in 2 weeks. Go to ER if severe/worsening chest pain or shortness of breath.

## 2015-01-05 NOTE — Telephone Encounter (Signed)
Contacted pt. Advised her that d dimer is negative.

## 2015-01-05 NOTE — Assessment & Plan Note (Signed)
Pt wishes to start med now for anxiety. Has done well in the past on prozac. Will restart.  Pt is advised to call if changes in mood occur after starting.

## 2015-01-05 NOTE — Addendum Note (Signed)
Addended by: Sandford Craze on: 01/05/2015 02:52 PM   Modules accepted: Kipp Brood

## 2015-01-05 NOTE — Progress Notes (Signed)
Pre visit review using our clinic review tool, if applicable. No additional management support is needed unless otherwise documented below in the visit note. 

## 2015-01-05 NOTE — Assessment & Plan Note (Signed)
Obtain cmet, tsh, CBC. Obtain D dimer to rule out PE given long car trip recently.

## 2015-01-05 NOTE — Assessment & Plan Note (Signed)
Uncontrolled. Increase metoprolol from  to  twice daily.

## 2015-01-05 NOTE — Progress Notes (Addendum)
Subjective:    Patient ID: Kayla Combs, female    DOB: 16-Nov-1970, 44 y.o.   MRN: [id number]  HPI  Pt is a 44 yr old female who presents today with chief complaint not feeling well x 1 week. She has multiple symptoms including, palpitations, shortness of breath, nervousness, tearfulness.  Reports recent increased stress. She recently travelled to [place] to see her husband's family and then travelled on to [place] to see her family. She drove to these destinations with her 3 children and husband.  Reports that she had stress at her parent's house because her children are loud and her parents like "quiet." She found it difficulty to find the balance between keeping her parents and children happy.    Two weeks ago, she had episode of heart racing and suddenly felt hot.  Had associated sense of doom. Things then  "felt ok."  Then 1 week ago she developed URI.  Had intermittent dizziness, heart racing. She continues to feel her heart racing, has a left sided neck pressure.  Not sleeping well.  Denies calf pain/swelling.   Reports that she has been treated for depression remotely- prozac worked better than lexapro for her.     Review of Systems See HPI  Past Medical History  Diagnosis Date  . Cough   . Sinusitis   . Hypertension   . Hyperlipidemia   . Migraine headache     Social History   Social History  . Marital Status: Married    Spouse Name: N/A  . Number of Children: 3  . Years of Education: N/A   Occupational History  . RN    Social History Main Topics  . Smoking status: Former Smoker -- 0.30 packs/day for 10 years    Types: Cigarettes    Quit date: 03/26/2011  . Smokeless tobacco: Never Used  . Alcohol Use: 0.0 - 2.0 oz/week    0-4 Standard drinks or equivalent per week  . Drug Use: No  . Sexual Activity: Not on file   Other Topics Concern  . Not on file   Social History Narrative    Past Surgical History  Procedure Laterality Date  .  Tonsillectomy and adenoidectomy    . Cesarean section      x 3     Family History  Problem Relation Age of Onset  . Cervical cancer Mother     No Known Allergies  Current Outpatient Prescriptions on File Prior to Visit  Medication Sig Dispense Refill  . ibuprofen (ADVIL,MOTRIN) 200 MG tablet Take 200 mg by mouth every 6 (six) hours as needed.    . metoprolol tartrate (LOPRESSOR) 25 MG tablet TAKE 1 TABLET BY MOUTH TWICE DAILY 180 tablet 1   No current facility-administered medications on file prior to visit.    BP 150/90 mmHg  Pulse 124  Temp(Src) 98.6 F (37 C) (Oral)  Resp 16  Ht 5' 3.5" (1.613 m)  Wt 235 lb (106.595 kg)  BMI 40.97 kg/m2  SpO2 100%  LMP 12/15/2014       Objective:   Physical Exam  Constitutional: She is oriented to person, place, and time. She appears well-developed and well-nourished.  HENT:  Head: Normocephalic and atraumatic.  Cardiovascular: Regular rhythm and normal heart sounds.  Tachycardia present.   No murmur heard. Pulmonary/Chest: Effort normal and breath sounds normal. No respiratory distress. She has no wheezes.  Musculoskeletal: She exhibits no edema.  Neurological: She is alert and oriented to person, place, and time.  Skin: Skin is warm and dry.  Psychiatric: Her behavior is normal. Judgment and thought content normal.  Tearful, nervous appearing.           Assessment & Plan:  EKG tracing is personally reviewed.  EKG notes NSR tachycardia.  No acute changes.   Case was reviewed with Dr. Beverely Low, pt's PCP.

## 2015-01-06 ENCOUNTER — Encounter: Payer: Self-pay | Admitting: Family

## 2015-01-06 LAB — CBC WITH DIFFERENTIAL/PLATELET
BASOS PCT: 0.8 % (ref 0.0–3.0)
Basophils Absolute: 0.1 10*3/uL (ref 0.0–0.1)
EOS PCT: 1.8 % (ref 0.0–5.0)
Eosinophils Absolute: 0.3 10*3/uL (ref 0.0–0.7)
HEMATOCRIT: 47.6 % — AB (ref 36.0–46.0)
HEMOGLOBIN: 16.3 g/dL — AB (ref 12.0–15.0)
LYMPHS PCT: 16.2 % (ref 12.0–46.0)
Lymphs Abs: 2.6 10*3/uL (ref 0.7–4.0)
MCHC: 34.2 g/dL (ref 30.0–36.0)
MCV: 86.5 fl (ref 78.0–100.0)
MONO ABS: 0.7 10*3/uL (ref 0.1–1.0)
Monocytes Relative: 4.3 % (ref 3.0–12.0)
Neutro Abs: 12.3 10*3/uL — ABNORMAL HIGH (ref 1.4–7.7)
Neutrophils Relative %: 76.9 % (ref 43.0–77.0)
Platelets: 445 10*3/uL — ABNORMAL HIGH (ref 150.0–400.0)
RBC: 5.5 Mil/uL — AB (ref 3.87–5.11)
RDW: 12.6 % (ref 11.5–15.5)
WBC: 16 10*3/uL — AB (ref 4.0–10.5)

## 2015-01-06 LAB — TSH: TSH: 1.84 u[IU]/mL (ref 0.35–4.50)

## 2015-01-06 LAB — COMPREHENSIVE METABOLIC PANEL
ALBUMIN: 4.7 g/dL (ref 3.5–5.2)
ALK PHOS: 77 U/L (ref 39–117)
ALT: 20 U/L (ref 0–35)
AST: 17 U/L (ref 0–37)
BUN: 20 mg/dL (ref 6–23)
CALCIUM: 9.9 mg/dL (ref 8.4–10.5)
CHLORIDE: 103 meq/L (ref 96–112)
CO2: 24 mEq/L (ref 19–32)
Creatinine, Ser: 0.8 mg/dL (ref 0.40–1.20)
GFR: 82.15 mL/min (ref >60.00)
Glucose, Bld: 97 mg/dL (ref 70–99)
POTASSIUM: 3.7 meq/L (ref 3.5–5.1)
Sodium: 139 mEq/L (ref 135–145)
TOTAL PROTEIN: 8.1 g/dL (ref 6.0–8.3)
Total Bilirubin: 0.5 mg/dL (ref 0.2–1.2)

## 2015-01-07 ENCOUNTER — Emergency Department (HOSPITAL_BASED_OUTPATIENT_CLINIC_OR_DEPARTMENT_OTHER): Payer: Managed Care, Other (non HMO)

## 2015-01-07 ENCOUNTER — Encounter (HOSPITAL_BASED_OUTPATIENT_CLINIC_OR_DEPARTMENT_OTHER): Payer: Self-pay

## 2015-01-07 ENCOUNTER — Observation Stay (HOSPITAL_BASED_OUTPATIENT_CLINIC_OR_DEPARTMENT_OTHER)
Admission: EM | Admit: 2015-01-07 | Discharge: 2015-01-09 | Disposition: A | Discharge status: Home / Self Care | Payer: Managed Care, Other (non HMO) | Attending: Internal Medicine | Admitting: Internal Medicine

## 2015-01-07 ENCOUNTER — Telehealth: Payer: Self-pay | Admitting: Family

## 2015-01-07 DIAGNOSIS — R002 Palpitations: Secondary | ICD-10-CM | POA: Insufficient documentation

## 2015-01-07 DIAGNOSIS — D72829 Elevated white blood cell count, unspecified: Secondary | ICD-10-CM | POA: Diagnosis not present

## 2015-01-07 DIAGNOSIS — R000 Tachycardia, unspecified: Secondary | ICD-10-CM | POA: Diagnosis not present

## 2015-01-07 DIAGNOSIS — E785 Hyperlipidemia, unspecified: Secondary | ICD-10-CM | POA: Insufficient documentation

## 2015-01-07 DIAGNOSIS — R079 Chest pain, unspecified: Principal | ICD-10-CM | POA: Insufficient documentation

## 2015-01-07 DIAGNOSIS — R51 Headache: Secondary | ICD-10-CM | POA: Diagnosis not present

## 2015-01-07 DIAGNOSIS — Z87891 Personal history of nicotine dependence: Secondary | ICD-10-CM | POA: Diagnosis not present

## 2015-01-07 DIAGNOSIS — E876 Hypokalemia: Secondary | ICD-10-CM | POA: Diagnosis not present

## 2015-01-07 DIAGNOSIS — F418 Other specified anxiety disorders: Secondary | ICD-10-CM | POA: Diagnosis not present

## 2015-01-07 DIAGNOSIS — E041 Nontoxic single thyroid nodule: Secondary | ICD-10-CM | POA: Diagnosis not present

## 2015-01-07 DIAGNOSIS — I10 Essential (primary) hypertension: Secondary | ICD-10-CM | POA: Diagnosis not present

## 2015-01-07 DIAGNOSIS — Z0189 Encounter for other specified special examinations: Secondary | ICD-10-CM

## 2015-01-07 DIAGNOSIS — F411 Generalized anxiety disorder: Secondary | ICD-10-CM | POA: Diagnosis present

## 2015-01-07 DIAGNOSIS — R0789 Other chest pain: Secondary | ICD-10-CM

## 2015-01-07 DIAGNOSIS — J45909 Unspecified asthma, uncomplicated: Secondary | ICD-10-CM | POA: Diagnosis not present

## 2015-01-07 HISTORY — DX: Unspecified asthma, uncomplicated: J45.909

## 2015-01-07 HISTORY — DX: Depression, unspecified: F32.A

## 2015-01-07 HISTORY — DX: Major depressive disorder, single episode, unspecified: F32.9

## 2015-01-07 LAB — RAPID URINE DRUG SCREEN, HOSP PERFORMED
AMPHETAMINES: NOT DETECTED
BARBITURATES: NOT DETECTED
BENZODIAZEPINES: NOT DETECTED
COCAINE: NOT DETECTED
OPIATES: NOT DETECTED
TETRAHYDROCANNABINOL: NOT DETECTED

## 2015-01-07 LAB — CBC WITH DIFFERENTIAL/PLATELET
BASOS PCT: 0 %
Basophils Absolute: 0 10*3/uL (ref 0.0–0.1)
EOS ABS: 0.3 10*3/uL (ref 0.0–0.7)
EOS PCT: 2 %
HEMATOCRIT: 41.2 % (ref 36.0–46.0)
HEMOGLOBIN: 14.3 g/dL (ref 12.0–15.0)
LYMPHS PCT: 24 %
Lymphs Abs: 3.7 10*3/uL (ref 0.7–4.0)
MCH: 29.1 pg (ref 26.0–34.0)
MCHC: 34.7 g/dL (ref 30.0–36.0)
MCV: 83.7 fL (ref 78.0–100.0)
Monocytes Absolute: 1.4 10*3/uL — ABNORMAL HIGH (ref 0.1–1.0)
Monocytes Relative: 9 %
NEUTROS ABS: 10 10*3/uL — AB (ref 1.7–7.7)
NEUTROS PCT: 65 %
Platelets: 366 10*3/uL (ref 150–400)
RBC: 4.92 MIL/uL (ref 3.87–5.11)
RDW: 12.6 % (ref 11.5–15.5)
WBC: 15.4 10*3/uL — ABNORMAL HIGH (ref 4.0–10.5)

## 2015-01-07 LAB — BASIC METABOLIC PANEL
ANION GAP: 9 (ref 5–15)
BUN: 16 mg/dL (ref 6–20)
CALCIUM: 9.2 mg/dL (ref 8.9–10.3)
CHLORIDE: 103 mmol/L (ref 101–111)
CO2: 25 mmol/L (ref 22–32)
CREATININE: 0.78 mg/dL (ref 0.44–1.00)
GFR calc non Af Amer: >60 mL/min (ref >60)
GLUCOSE: 106 mg/dL — AB (ref 65–99)
Potassium: 3.4 mmol/L — ABNORMAL LOW (ref 3.5–5.1)
Sodium: 137 mmol/L (ref 135–145)

## 2015-01-07 LAB — URINE MICROSCOPIC-ADD ON

## 2015-01-07 LAB — PROTIME-INR
INR: 0.96 (ref 0.00–1.49)
Prothrombin Time: 13 seconds (ref 11.6–15.2)

## 2015-01-07 LAB — PREGNANCY, URINE: Preg Test, Ur: NEGATIVE

## 2015-01-07 LAB — URINALYSIS, ROUTINE W REFLEX MICROSCOPIC
BILIRUBIN URINE: NEGATIVE
Glucose, UA: NEGATIVE mg/dL
Ketones, ur: NEGATIVE mg/dL
LEUKOCYTES UA: NEGATIVE
NITRITE: NEGATIVE
PH: 6 (ref 5.0–8.0)
Protein, ur: NEGATIVE mg/dL
SPECIFIC GRAVITY, URINE: 1.005 (ref 1.005–1.030)
UROBILINOGEN UA: 0.2 mg/dL (ref 0.0–1.0)

## 2015-01-07 LAB — MAGNESIUM: Magnesium: 1.5 mg/dL — ABNORMAL LOW (ref 1.7–2.4)

## 2015-01-07 LAB — SEDIMENTATION RATE: Sed Rate: 25 mm/hr — ABNORMAL HIGH (ref 0–22)

## 2015-01-07 LAB — PHOSPHORUS: PHOSPHORUS: 2.9 mg/dL (ref 2.5–4.6)

## 2015-01-07 LAB — RAPID STREP SCREEN (MED CTR MEBANE ONLY): STREPTOCOCCUS, GROUP A SCREEN (DIRECT): NEGATIVE

## 2015-01-07 MED ORDER — IOHEXOL 350 MG/ML SOLN
100.0000 mL | Freq: Once | INTRAVENOUS | Status: AC | PRN
  Administered 2015-01-07: 100 mL via INTRAVENOUS

## 2015-01-07 MED ORDER — SODIUM CHLORIDE 0.9 % IV BOLUS (SEPSIS)
1000.0000 mL | Freq: Once | INTRAVENOUS | Status: AC
  Administered 2015-01-07: 1000 mL via INTRAVENOUS

## 2015-01-07 MED ORDER — SODIUM CHLORIDE 0.9 % IV SOLN
1000.0000 mL | Freq: Once | INTRAVENOUS | Status: AC
  Administered 2015-01-07: 1000 mL via INTRAVENOUS

## 2015-01-07 MED ORDER — SODIUM CHLORIDE 0.9 % IV SOLN
1000.0000 mL | INTRAVENOUS | Status: DC
  Administered 2015-01-07: 1000 mL via INTRAVENOUS

## 2015-01-07 MED ORDER — DOXYCYCLINE HYCLATE 100 MG PO TABS
100.0000 mg | ORAL_TABLET | Freq: Once | ORAL | Status: AC
Start: 2015-01-07 — End: 2015-01-07
  Administered 2015-01-07: 100 mg via ORAL
  Filled 2015-01-07: qty 1

## 2015-01-07 NOTE — ED Notes (Signed)
MD at bedside. 

## 2015-01-07 NOTE — Telephone Encounter (Signed)
Notified pt. She states she had a temp over the weekend of 100.4, has had a cold (runny nose, cough, congestion) that she is still dealing with. Pt will be out of town in 1 week and has appt with [facility] on 01/21/15.  Pt wants to know if ok to wait until that visit to repeat CBC?  Advised her if she doesn't hear back from [place], ok to wait.  Please advise?

## 2015-01-07 NOTE — ED Provider Notes (Signed)
CSN: [id number]     Arrival date & time 01/07/15  9562 History   This chart was scribed for Arby Barrette, MD by Arlan Organ, ED Scribe. This patient was seen in room MH08/MH08 and the patient's care was started 7:27 PM.   Chief Complaint  Patient presents with  . Tachycardia   HPI  HPI Comments: Kayla Combs is a 44 y.o. female with a PMHx of HTN and hyperlipidemia who presents to the Emergency Department here for tachycardia, nausea, headache and lightheadedness. Pt reports 2 intense episodes over the last week, lasting approximately 15 minutes each with associated nausea and then tachycardia and weakness with near syncopal feeeling. Associated heaviness to chest, and nausea reported with episodes. Tonight she felt very weak and nauseated with her heart rate elevated, she considered calling an ambulance but had her husband drive her to the emergency department. She had recently been on a fairly long driving trip. First she was in [place] doing outdoor activities then subsequently drove to [place]. She does not recall any specific tick bite or rash. She states she did have several mosquito bites. Overall her symptoms started a week ago with some generalized fatigue and malaise. Also had a fever to 100.4 2 days ago. Pt also reports feeling less comfortable on her L side, it creates a sensation of fullness in her chest. Patient was evaluated by her PCP 2 days ago for same. Labs drawn and EKG performed at time of visit. She has not improved.  Past Medical History  Diagnosis Date  . Cough   . Sinusitis   . Hypertension   . Hyperlipidemia   . Migraine headache   . Depression   . Asthma    Past Surgical History  Procedure Laterality Date  . Tonsillectomy and adenoidectomy    . Cesarean section      x 3    Family History  Problem Relation Age of Onset  . Cervical cancer Mother    Social History  Substance Use Topics  . Smoking status: Former Smoker -- 0.30 packs/day for 10  years    Types: Cigarettes    Quit date: 03/26/2011  . Smokeless tobacco: Never Used  . Alcohol Use: 0.0 - 2.0 oz/week    0-4 Standard drinks or equivalent per week   OB History    No data available     Review of Systems  A complete 10 system review of systems was obtained and all systems are negative except as noted in the HPI and PMH.    Allergies  Review of patient's allergies indicates no known allergies.  Home Medications   Prior to Admission medications   Medication Sig Start Date End Date Taking? Authorizing Provider  FLUoxetine (PROZAC) 20 MG tablet 1/2 tab by mouth once daily for 1 week, then increase to a full tab once daily on week two. 01/05/15   Sandford Craze, NP  ibuprofen (ADVIL,MOTRIN) 200 MG tablet Take 200 mg by mouth every 6 (six) hours as needed.    Historical Provider, MD  metoprolol tartrate (LOPRESSOR) 25 MG tablet Take 50 mg by mouth 2 (two) times daily.    Historical Provider, MD   Triage Vitals: BP 131/76 mmHg  Pulse 103  Temp(Src) 99 F (37.2 C) (Oral)  Resp 14  Ht 5\' 3"  (1.6 m)  Wt 235 lb (106.595 kg)  BMI 41.64 kg/m2  SpO2 100%  LMP 12/15/2014   Physical Exam  Constitutional: She is oriented to person, place, and time. She  appears well-developed and well-nourished.  HENT:  Head: Normocephalic and atraumatic.  Eyes: EOM are normal. Pupils are equal, round, and reactive to light.  Neck: Neck supple.  Cardiovascular: Regular rhythm, normal heart sounds and intact distal pulses.   Tachycardia to 120  Pulmonary/Chest: Effort normal and breath sounds normal.  Abdominal: Soft. Bowel sounds are normal. She exhibits no distension. There is no tenderness.  Musculoskeletal: Normal range of motion. She exhibits no edema or tenderness.  Neurological: She is alert and oriented to person, place, and time. She has normal strength. No cranial nerve deficit. She exhibits normal muscle tone. Coordination normal. GCS eye subscore is 4. GCS verbal subscore  is 5. GCS motor subscore is 6.  Skin: Skin is warm, dry and intact. No rash noted.  Psychiatric: She has a normal mood and affect.    ED Course  Procedures (including critical care time)  DIAGNOSTIC STUDIES: Oxygen Saturation is 100% on RA, Normal by my interpretation.    COORDINATION OF CARE: 7:32 PM-Discussed treatment plan with pt at bedside and pt agreed to plan.     Labs Review Labs Reviewed  BASIC METABOLIC PANEL - Abnormal; Notable for the following:    Potassium 3.4 (*)    Glucose, Bld 106 (*)    All other components within normal limits  CBC WITH DIFFERENTIAL/PLATELET - Abnormal; Notable for the following:    WBC 15.4 (*)    Neutro Abs 10.0 (*)    Monocytes Absolute 1.4 (*)    All other components within normal limits  URINALYSIS, ROUTINE W REFLEX MICROSCOPIC (NOT AT [facility]) - Abnormal; Notable for the following:    Hgb urine dipstick TRACE (*)    All other components within normal limits  SEDIMENTATION RATE - Abnormal; Notable for the following:    Sed Rate 25 (*)    All other components within normal limits  MAGNESIUM - Abnormal; Notable for the following:    Magnesium 1.5 (*)    All other components within normal limits  RAPID STREP SCREEN (NOT AT [facility])  CULTURE, GROUP A STREP  CULTURE, BLOOD (ROUTINE X 2)  CULTURE, BLOOD (ROUTINE X 2)  PROTIME-INR  PREGNANCY, URINE  URINE RAPID DRUG SCREEN, HOSP PERFORMED  PHOSPHORUS  URINE MICROSCOPIC-ADD ON  EBV AB TO VIRAL CAPSID AG PNL, IGG+IGM  B. BURGDORFI ANTIBODIES  TROPONIN I    Imaging Review Ct Angio Chest Pe W/cm &/or Wo Cm  01/07/2015   CLINICAL DATA:  Tachycardia 1 week with chest heaviness, dizziness and nausea. Recent travel 3-4 weeks ago.  EXAM: CT ANGIOGRAPHY CHEST WITH CONTRAST  TECHNIQUE: Multidetector CT imaging of the chest was performed using the standard protocol during bolus administration of intravenous contrast. Multiplanar CT image reconstructions and MIPs were obtained to evaluate the vascular  anatomy.  CONTRAST:  OMNIPAQUE IOHEXOL 350 MG/ML SOLN  COMPARISON:  Chest x-ray 06/20/2011  FINDINGS: Lungs are well inflated without consolidation or effusion. Airways are within normal.  Heart is within normal in size. Pulmonary arterial system is normal without evidence of emboli. There is no mediastinal, hilar or axillary adenopathy. Remaining mediastinal structures are within normal. There is a 9 mm hypodense nodule over the right lobe of the thyroid gland.  Images through the upper abdomen demonstrate mild prominence of the spleen. There are mild degenerative changes of the spine.  Review of the MIP images confirms the above findings.  IMPRESSION: No acute cardiopulmonary disease and no evidence of pulmonary embolism.  9 mm hypodense nodule over the  right lobe of the thyroid likely a cyst or adenoma.   Electronically Signed   By: Elberta Fortis M.D.   On: 01/07/2015 21:14   I have personally reviewed and evaluated these images and lab results as part of my medical decision-making.   EKG Interpretation   Date/Time:  Wednesday January 07 2015 19:03:00 EDT Ventricular Rate:  114 PR Interval:  162 QRS Duration: 104 QT Interval:  330 QTC Calculation: 454 R Axis:   60 Text Interpretation:  Sinus tachycardia no acute ischemic changes  Confirmed by Donnald Garre, MD, Lebron Conners [phone]) on 01/07/2015 9:49:54 PM      MDM   Final diagnoses:  Tachycardia  Leukocytosis  Other chest pain   Patient presents with constellation of symptoms including palpitations and tachycardia. As well there is generalized fatigue and nausea with lightheadedness. Patient has been hydrated but continues to be tachycardic. At this point time she will be placed in observation for further diagnostic evaluation. With leukocytosis and fever over the weekend this appears most likely to be infectious in etiology. With the patient's travel history to [place] with outdoor activities, consideration has been given to zoonatic/tick  type infection with labs obtained. At this time source does not localize to UTI\pneumonia or intra-abdominal infection.  Arby Barrette, MD 01/08/15 [phone]

## 2015-01-07 NOTE — Telephone Encounter (Signed)
White blood cell count and platelets elevated. Is she having any sign of infection (UTI, skin, dental infection, fever?).  I would recommend that she repeat cbc with diff in 1 week dx leukocytosis. Keep upcoming apt with Dr. Beverely Low.

## 2015-01-07 NOTE — ED Notes (Signed)
Pt returned from CT °

## 2015-01-07 NOTE — Telephone Encounter (Signed)
That should be ok, as long as she is re-evaluated sooner (could go to urgent care if out of town) if symptoms worse, or if she develops fever >101.

## 2015-01-07 NOTE — ED Notes (Signed)
Pt with c/o tachycardia x 2 episodes over the last week-was seen by PCP 2 days ago-states she labs, EKG-pt tearful and anxious-dx with anxiety with rx prozac

## 2015-01-08 ENCOUNTER — Observation Stay (HOSPITAL_BASED_OUTPATIENT_CLINIC_OR_DEPARTMENT_OTHER): Payer: Managed Care, Other (non HMO)

## 2015-01-08 DIAGNOSIS — R002 Palpitations: Secondary | ICD-10-CM | POA: Diagnosis not present

## 2015-01-08 DIAGNOSIS — I10 Essential (primary) hypertension: Secondary | ICD-10-CM | POA: Diagnosis not present

## 2015-01-08 DIAGNOSIS — Z008 Encounter for other general examination: Secondary | ICD-10-CM

## 2015-01-08 DIAGNOSIS — D72829 Elevated white blood cell count, unspecified: Secondary | ICD-10-CM | POA: Diagnosis present

## 2015-01-08 DIAGNOSIS — R000 Tachycardia, unspecified: Secondary | ICD-10-CM | POA: Diagnosis not present

## 2015-01-08 DIAGNOSIS — F418 Other specified anxiety disorders: Secondary | ICD-10-CM | POA: Diagnosis not present

## 2015-01-08 DIAGNOSIS — Z0189 Encounter for other specified special examinations: Secondary | ICD-10-CM

## 2015-01-08 LAB — COMPREHENSIVE METABOLIC PANEL
ALBUMIN: 3.5 g/dL (ref 3.5–5.0)
ALT: 19 U/L (ref 14–54)
ANION GAP: 5 (ref 5–15)
AST: 17 U/L (ref 15–41)
Alkaline Phosphatase: 54 U/L (ref 38–126)
BUN: 8 mg/dL (ref 6–20)
CHLORIDE: 110 mmol/L (ref 101–111)
CO2: 25 mmol/L (ref 22–32)
Calcium: 8.3 mg/dL — ABNORMAL LOW (ref 8.9–10.3)
Creatinine, Ser: 0.68 mg/dL (ref 0.44–1.00)
GFR calc Af Amer: >60 mL/min (ref >60)
Glucose, Bld: 103 mg/dL — ABNORMAL HIGH (ref 65–99)
POTASSIUM: 4.1 mmol/L (ref 3.5–5.1)
Sodium: 140 mmol/L (ref 135–145)
Total Bilirubin: 0.8 mg/dL (ref 0.3–1.2)
Total Protein: 5.9 g/dL — ABNORMAL LOW (ref 6.5–8.1)

## 2015-01-08 LAB — CBC WITH DIFFERENTIAL/PLATELET
BASOS ABS: 0 10*3/uL (ref 0.0–0.1)
BASOS PCT: 0 %
EOS PCT: 3 %
Eosinophils Absolute: 0.3 10*3/uL (ref 0.0–0.7)
HCT: 37.5 % (ref 36.0–46.0)
Hemoglobin: 12.8 g/dL (ref 12.0–15.0)
Lymphocytes Relative: 26 %
Lymphs Abs: 2.8 10*3/uL (ref 0.7–4.0)
MCH: 29 pg (ref 26.0–34.0)
MCHC: 34.1 g/dL (ref 30.0–36.0)
MCV: 84.8 fL (ref 78.0–100.0)
MONO ABS: 0.9 10*3/uL (ref 0.1–1.0)
Monocytes Relative: 9 %
Neutro Abs: 6.6 10*3/uL (ref 1.7–7.7)
Neutrophils Relative %: 62 %
PLATELETS: 308 10*3/uL (ref 150–400)
RBC: 4.42 MIL/uL (ref 3.87–5.11)
RDW: 12.7 % (ref 11.5–15.5)
WBC: 10.5 10*3/uL (ref 4.0–10.5)

## 2015-01-08 LAB — MAGNESIUM: MAGNESIUM: 1.9 mg/dL (ref 1.7–2.4)

## 2015-01-08 LAB — TROPONIN I: Troponin I: <0.03 ng/mL (ref <0.031)

## 2015-01-08 MED ORDER — METOPROLOL TARTRATE 50 MG PO TABS
50.0000 mg | ORAL_TABLET | Freq: Two times a day (BID) | ORAL | Status: DC
  Administered 2015-01-08 – 2015-01-09 (×3): 50 mg via ORAL
  Filled 2015-01-08 (×3): qty 1

## 2015-01-08 MED ORDER — SODIUM CHLORIDE 0.9 % IV SOLN
INTRAVENOUS | Status: DC
  Administered 2015-01-08 – 2015-01-09 (×3): via INTRAVENOUS

## 2015-01-08 MED ORDER — MAGNESIUM SULFATE IN D5W 10-5 MG/ML-% IV SOLN
1.0000 g | Freq: Once | INTRAVENOUS | Status: AC
  Administered 2015-01-08: 1 g via INTRAVENOUS
  Filled 2015-01-08: qty 100

## 2015-01-08 MED ORDER — ACETAMINOPHEN 325 MG PO TABS
650.0000 mg | ORAL_TABLET | ORAL | Status: DC | PRN
  Administered 2015-01-08: 650 mg via ORAL
  Filled 2015-01-08: qty 2

## 2015-01-08 MED ORDER — ACETAMINOPHEN 325 MG PO TABS
650.0000 mg | ORAL_TABLET | Freq: Once | ORAL | Status: AC
  Administered 2015-01-08: 650 mg via ORAL

## 2015-01-08 MED ORDER — ALPRAZOLAM 0.25 MG PO TABS
0.2500 mg | ORAL_TABLET | Freq: Two times a day (BID) | ORAL | Status: DC | PRN
  Administered 2015-01-08 – 2015-01-09 (×2): 0.25 mg via ORAL
  Filled 2015-01-08 (×2): qty 1

## 2015-01-08 MED ORDER — FLUOXETINE HCL 10 MG PO CAPS
10.0000 mg | ORAL_CAPSULE | Freq: Every day | ORAL | Status: DC
  Administered 2015-01-08 – 2015-01-09 (×2): 10 mg via ORAL
  Filled 2015-01-08 (×2): qty 1

## 2015-01-08 MED ORDER — SODIUM CHLORIDE 0.9 % IV SOLN: INTRAVENOUS | Status: DC

## 2015-01-08 MED ORDER — METOPROLOL TARTRATE 50 MG PO TABS
50.0000 mg | ORAL_TABLET | Freq: Once | ORAL | Status: AC
Start: 2015-01-08 — End: 2015-01-08
  Administered 2015-01-08: 50 mg via ORAL
  Filled 2015-01-08: qty 1

## 2015-01-08 MED ORDER — ONDANSETRON HCL 4 MG/2ML IJ SOLN: 4.0000 mg | Freq: Four times a day (QID) | INTRAMUSCULAR | Status: DC | PRN

## 2015-01-08 MED ORDER — PERFLUTREN LIPID MICROSPHERE
1.0000 mL | INTRAVENOUS | Status: AC | PRN
  Administered 2015-01-08: 2 mL via INTRAVENOUS
  Filled 2015-01-08: qty 10

## 2015-01-08 MED ORDER — ENOXAPARIN SODIUM 40 MG/0.4ML ~~LOC~~ SOLN
40.0000 mg | Freq: Every day | SUBCUTANEOUS | Status: DC
  Administered 2015-01-08: 40 mg via SUBCUTANEOUS
  Filled 2015-01-08 (×2): qty 0.4

## 2015-01-08 MED ORDER — ACETAMINOPHEN 325 MG PO TABS
ORAL_TABLET | ORAL | Status: AC
  Filled 2015-01-08: qty 2

## 2015-01-08 MED ORDER — POTASSIUM CHLORIDE CRYS ER 20 MEQ PO TBCR
40.0000 meq | EXTENDED_RELEASE_TABLET | Freq: Once | ORAL | Status: AC
  Administered 2015-01-08: 40 meq via ORAL
  Filled 2015-01-08: qty 2

## 2015-01-08 MED ORDER — BUTALBITAL-APAP-CAFFEINE 50-325-40 MG PO TABS
1.0000 | ORAL_TABLET | ORAL | Status: DC | PRN
  Administered 2015-01-08 – 2015-01-09 (×3): 1 via ORAL
  Filled 2015-01-08 (×3): qty 1

## 2015-01-08 NOTE — Progress Notes (Signed)
Patient Demographics  Kayla Combs, is a 44 y.o. female, DOB - 1970-07-16, RUE:[id number]  Admit date - 01/07/2015   Admitting Physician Therisa Doyne, MD  Outpatient Primary MD for the patient is Neena Rhymes, MD  LOS -    Chief Complaint  Patient presents with  . Tachycardia         Subjective:   [name] today has, No headache, No chest pain, No abdominal pain - No Nausea,  No Cough - SOB. Reports significant improvement of her weakness, no recurrence of palpitation.  Assessment & Plan    Principal Problem:   Tachycardia Active Problems:   HTN (hypertension)   Depression with anxiety   Leukocytosis   Needs sleep apnea assessment  Tachycardia - Patient reports symptoms of viral illness over the last week, including sore throat, Raynaud's, generalized malaise and body ache, tachycardia most likely related to dehydration. - Improving with IV fluids - EKG nonacute - No significant events on telemetry - Continue with IV fluids - Check 2-D echo - CTA chest negative for PE(was done giving recent long distance driving to [place])  Leukocytosis -  likely stress related, or related to viral illness, at this point no indication for antibiotics, will follow on septic workup. - Resolved with hydration  Hypertension - Continue with Lopressor  Depression anxiety - Continue with home medication  Code Status: Full  Family Communication: none at bedside  Disposition Plan: home in 24 hours   Procedures  none   Consults   none   Medications  Scheduled Meds: . enoxaparin (LOVENOX) injection  40 mg Subcutaneous Daily  . FLUoxetine  10 mg Oral Daily  . metoprolol tartrate  50 mg Oral BID   Continuous Infusions: . sodium chloride     PRN Meds:.acetaminophen, ALPRAZolam, ondansetron (ZOFRAN) IV  DVT Prophylaxis  Lovenox -   Lab Results    Component Value Date   PLT 308 01/08/2015    Antibiotics    Anti-infectives    Start     Dose/Rate Route Frequency Ordered Stop   01/07/15 2315  doxycycline (VIBRA-TABS) tablet 100 mg     100 mg Oral  Once 01/07/15 2304 01/07/15 2316          Objective:   Filed Vitals:   01/08/15 0100 01/08/15 0215 01/08/15 0638 01/08/15 1056  BP: 139/86 133/71 118/60 128/81  Pulse: 86 76 84 84  Temp:  97.9 F (36.6 C) 97.5 F (36.4 C)   TempSrc:  Oral Oral   Resp: 15 16 16    Height:  5\' 4"  (1.626 m)    Weight:  108.909 kg (240 lb 1.6 oz)    SpO2: 98% 99% 100%     Wt Readings from Last 3 Encounters:  01/08/15 108.909 kg (240 lb 1.6 oz)  01/05/15 106.595 kg (235 lb)  09/24/14 100.869 kg (222 lb 6 oz)     Intake/Output Summary (Last 24 hours) at 01/08/15 1214 Last data filed at 01/08/15 1057  Gross per 24 hour  Intake    635 ml  Output   1950 ml  Net  -1315 ml     Physical Exam  Awake Alert, Oriented X 3, No new F.N deficits, Normal affect [place].AT,PERRAL Supple Neck,No JVD, No cervical  lymphadenopathy appriciated.  Symmetrical Chest wall movement, Good air movement bilaterally, CTAB RRR,No Gallops,Rubs or new Murmurs, No Parasternal Heave +ve B.Sounds, Abd Soft, No tenderness, No organomegaly appriciated, No rebound - guarding or rigidity. No Cyanosis, Clubbing or edema, No new Rash or bruise    Data Review   Micro Results Recent Results (from the past 240 hour(s))  Rapid strep screen     Status: None   Collection Time: 01/07/15 10:55 PM  Result Value Ref Range Status   Streptococcus, Group A Screen (Direct) NEGATIVE NEGATIVE Final    Comment: (NOTE) A Rapid Antigen test may result negative if the antigen level in the sample is below the detection level of this test. The FDA has not cleared this test as a stand-alone test therefore the rapid antigen negative result has reflexed to a Group A Strep culture.     Radiology Reports Ct Angio Chest Pe W/cm &/or Wo  Cm  01/07/2015   CLINICAL DATA:  Tachycardia 1 week with chest heaviness, dizziness and nausea. Recent travel 3-4 weeks ago.  EXAM: CT ANGIOGRAPHY CHEST WITH CONTRAST  TECHNIQUE: Multidetector CT imaging of the chest was performed using the standard protocol during bolus administration of intravenous contrast. Multiplanar CT image reconstructions and MIPs were obtained to evaluate the vascular anatomy.  CONTRAST:  OMNIPAQUE IOHEXOL 350 MG/ML SOLN  COMPARISON:  Chest x-ray 06/20/2011  FINDINGS: Lungs are well inflated without consolidation or effusion. Airways are within normal.  Heart is within normal in size. Pulmonary arterial system is normal without evidence of emboli. There is no mediastinal, hilar or axillary adenopathy. Remaining mediastinal structures are within normal. There is a 9 mm hypodense nodule over the right lobe of the thyroid gland.  Images through the upper abdomen demonstrate mild prominence of the spleen. There are mild degenerative changes of the spine.  Review of the MIP images confirms the above findings.  IMPRESSION: No acute cardiopulmonary disease and no evidence of pulmonary embolism.  9 mm hypodense nodule over the right lobe of the thyroid likely a cyst or adenoma.   Electronically Signed   By: Elberta Fortis M.D.   On: 01/07/2015 21:14     CBC  Recent Labs Lab 01/05/15 1423 01/07/15 2020 01/08/15 0612  WBC 16.0* 15.4* 10.5  HGB 16.3* 14.3 12.8  HCT 47.6* 41.2 37.5  PLT 445.0* 366 308  MCV 86.5 83.7 84.8  MCH  --  29.1 29.0  MCHC 34.2 34.7 34.1  RDW 12.6 12.6 12.7  LYMPHSABS 2.6 3.7 2.8  MONOABS 0.7 1.4* 0.9  EOSABS 0.3 0.3 0.3  BASOSABS 0.1 0.0 0.0    Chemistries   Recent Labs Lab 01/05/15 1423 01/07/15 2020 01/08/15 0612  NA 139 137 140  K 3.7 3.4* 4.1  CL 103 103 110  CO2 24 25 25   GLUCOSE 97 106* 103*  BUN 20 16 8   CREATININE 0.80 0.78 0.68  CALCIUM 9.9 9.2 8.3*  MG  --  1.5* 1.9  AST 17  --  17  ALT 20  --  19  ALKPHOS 77  --  54   BILITOT 0.5  --  0.8   ------------------------------------------------------------------------------------------------------------------ estimated creatinine clearance is 107.1 mL/min (by C-G formula based on Cr of 0.68). ------------------------------------------------------------------------------------------------------------------ No results for input(s): HGBA1C in the last 72 hours. ------------------------------------------------------------------------------------------------------------------ No results for input(s): CHOL, HDL, LDLCALC, TRIG, CHOLHDL, LDLDIRECT in the last 72 hours. ------------------------------------------------------------------------------------------------------------------  Recent Labs  01/05/15 1423  TSH 1.84   ------------------------------------------------------------------------------------------------------------------ No results for input(s):  VITAMINB12, FOLATE, FERRITIN, TIBC, IRON, RETICCTPCT in the last 72 hours.  Coagulation profile  Recent Labs Lab 01/07/15 1946  INR 0.96     Recent Labs  01/05/15 1423  DDIMER 0.33    Cardiac Enzymes  Recent Labs Lab 01/08/15 0030  TROPONINI <0.03   ------------------------------------------------------------------------------------------------------------------ Invalid input(s): POCBNP     Time Spent in minutes   30 minutes   Revonda Menter M.D on 01/08/2015 at 12:14 PM  Between 7am to 7pm - Pager - [phone]  After 7pm go to [email or web address] - password [email or web address]  [facility]   Office  [phone]

## 2015-01-08 NOTE — H&P (Signed)
[facility] History and Physical  Patient: Kayla Combs  MRN: [id number]  DOB: [birth year]  DOS: the patient was seen and examined on 01/08/2015 PCP: Neena Rhymes, MD  Referring physician: Dr. Clarice Pole Chief Complaint: Palpitation  HPI: Kayla Combs is a 44 y.o. female with Past medical history of hypertension, dyslipidemia, mood disorder. The patient presents with complains of palpitation and not feeling well. Patient mentions that she was visiting [place] 3 weeks ago and was under a lot of stress during that visit. After the visit the patient started having complaints of palpitation. The palpitation. Code while she was driving. Later on that day occurring throughout the day. She had multiple episodes. During the day associated with the nausea. She also complains of some headache which was frontal as well as on the occipital region. She had some neck rigidity with that happens headache as well. She denies having any fever, photophobia, confusion, fall, trauma, injury, loss of control of bowel or bladder, abdominal pain, diarrhea, constipation, burning urination. She denies having any shortness of breath on exertion as well. With this she went to see her PCP and the pain increases or goes off of Lopressor and also place her on Axid. Despite this medication changes she continues to have the symptoms and therefore decided to come to the hospital. There was initial concern of possible tickborne illness and therefore the patient was given doxycycline. At the time of my evaluation patient categorically denied having any exposure to ticks. She did have been in area where there was more grass but no [facility] or wood trip. She denies having any tick bites, she denies having any rash anywhere.  The patient is coming from home.  At her baseline ambulates without support And is independent for most of her ADL; manages her medication on her own.  Review of Systems: as  mentioned in the history of present illness.  A comprehensive review of the other systems is negative.  Past Medical History  Diagnosis Date  . Cough   . Sinusitis   . Hypertension   . Hyperlipidemia   . Migraine headache   . Depression   . Asthma    Past Surgical History  Procedure Laterality Date  . Tonsillectomy and adenoidectomy    . Cesarean section      x 3    Social History:  reports that she quit smoking about 3 years ago. Her smoking use included Cigarettes. She has a 3 pack-year smoking history. She has never used smokeless tobacco. She reports that she drinks alcohol. She reports that she does not use illicit drugs.  No Known Allergies  Family History  Problem Relation Age of Onset  . Cervical cancer Mother     Prior to Admission medications   Medication Sig Start Date End Date Taking? Authorizing Provider  FLUoxetine (PROZAC) 20 MG tablet 1/2 tab by mouth once daily for 1 week, then increase to a full tab once daily on week two. 01/05/15   Sandford Craze, NP  ibuprofen (ADVIL,MOTRIN) 200 MG tablet Take 200 mg by mouth every 6 (six) hours as needed.    Historical Provider, MD  metoprolol tartrate (LOPRESSOR) 25 MG tablet Take 50 mg by mouth 2 (two) times daily.    Historical Provider, MD    Physical Exam: Filed Vitals:   01/07/15 2231 01/08/15 0022 01/08/15 0100 01/08/15 0215  BP: 131/76 142/83 139/86 133/71  Pulse: 103 101 86 76  Temp:    97.9 F (36.6 C)  TempSrc:  Oral  Resp: 14  15 16   Height:    5\' 4"  (1.626 m)  Weight:    108.909 kg (240 lb 1.6 oz)  SpO2: 100%  98% 99%    General: Alert, Awake and Oriented to Time, Place and Person. Appear in mild distress Eyes: PERRL ENT: Oral Mucosa clear moist. Neck: no JVD Cardiovascular: S1 and S2 Present, no Murmur, Peripheral Pulses Present Respiratory: Bilateral Air entry equal and Decreased,  Clear to Auscultation, no Crackles, no wheezes Abdomen: Bowel Sound present, Soft and no tenderness Skin:  no Rash Extremities: no Pedal edema, no calf tenderness Neurologic: Grossly no focal neuro deficit. Labs on Admission:  CBC:  Recent Labs Lab 01/05/15 1423 01/07/15 2020  WBC 16.0* 15.4*  NEUTROABS 12.3* 10.0*  HGB 16.3* 14.3  HCT 47.6* 41.2  MCV 86.5 83.7  PLT 445.0* 366    CMP     Component Value Date/Time   NA 137 01/07/2015 2020   K 3.4* 01/07/2015 2020   CL 103 01/07/2015 2020   CO2 25 01/07/2015 2020   GLUCOSE 106* 01/07/2015 2020   BUN 16 01/07/2015 2020   CREATININE 0.78 01/07/2015 2020   CALCIUM 9.2 01/07/2015 2020   PROT 8.1 01/05/2015 1423   ALBUMIN 4.7 01/05/2015 1423   AST 17 01/05/2015 1423   ALT 20 01/05/2015 1423   ALKPHOS 77 01/05/2015 1423   BILITOT 0.5 01/05/2015 1423   GFRNONAA >60 01/07/2015 2020   GFRAA >60 01/07/2015 2020     Recent Labs Lab 01/08/15 0030  TROPONINI <0.03   BNP (last 3 results) No results for input(s): BNP in the last 8760 hours.  ProBNP (last 3 results) No results for input(s): PROBNP in the last 8760 hours.   Radiological Exams on Admission: Ct Angio Chest Pe W/cm &/or Wo Cm  01/07/2015   CLINICAL DATA:  Tachycardia 1 week with chest heaviness, dizziness and nausea. Recent travel 3-4 weeks ago.  EXAM: CT ANGIOGRAPHY CHEST WITH CONTRAST  TECHNIQUE: Multidetector CT imaging of the chest was performed using the standard protocol during bolus administration of intravenous contrast. Multiplanar CT image reconstructions and MIPs were obtained to evaluate the vascular anatomy.  CONTRAST:  OMNIPAQUE IOHEXOL 350 MG/ML SOLN  COMPARISON:  Chest x-ray 06/20/2011  FINDINGS: Lungs are well inflated without consolidation or effusion. Airways are within normal.  Heart is within normal in size. Pulmonary arterial system is normal without evidence of emboli. There is no mediastinal, hilar or axillary adenopathy. Remaining mediastinal structures are within normal. There is a 9 mm hypodense nodule over the right lobe of the thyroid  gland.  Images through the upper abdomen demonstrate mild prominence of the spleen. There are mild degenerative changes of the spine.  Review of the MIP images confirms the above findings.  IMPRESSION: No acute cardiopulmonary disease and no evidence of pulmonary embolism.  9 mm hypodense nodule over the right lobe of the thyroid likely a cyst or adenoma.   Electronically Signed   By: Elberta Fortis M.D.   On: 01/07/2015 21:14   EKG: Independently reviewed. sinus tachycardia.  Assessment/Plan 1. Tachycardia The patient is presenting with complaints of recurrent tachycardia. Disposition with dizziness as well as shortness of breath as well as nausea. At the time of my evaluation she is asymptomatic. The patient did have sinus tachycardia on the EKG. No prolonged QT interval. Patient did have some hypomagnesemia as well as hypokalemia. With this the patient admitted currently admitted in telemetry unit. I would replace her  potassium and magnesium level. Monitor her on telemetry. Most likely stress related.  2.HTN (hypertension) Continue home dose Lopressor.  3. Depression with anxiety Continuing Prozac. Adding Xanax as needed.  4.Leukocytosis There was initially some concern about having an ongoing infection although she denies having any infection symptoms no fever no chills no malaise most muscle aches no rash no joint pain no abnormality on UA or chest x-ray. This at present I do not favor giving her any antibiotics. She has extensive serological as well as microbiological workup pending. Would recommend follow-up as an outpatient.  Nutrition: Full diet DVT Prophylaxis: subcutaneous Heparin  Advance goals of care discussion: Full code   Disposition: Admitted as observation telemetry unit. Estimated length of stay: One day  Author: Lynden Oxford, MD [facility] Pager: [phone] 01/08/2015  If 7PM-7AM, please contact night-coverage [email or web address] Password [email or web address]

## 2015-01-08 NOTE — Progress Notes (Signed)
Pt feels no relief with admin of Fioricet, Esgic 50-325-40.  Gave pt heat pads for back of neck pain.  Will monitor.

## 2015-01-08 NOTE — Progress Notes (Signed)
Echocardiogram 2D Echocardiogram with Definity has been performed.  Kayla Combs 01/08/2015, 4:56 PM

## 2015-01-09 ENCOUNTER — Telehealth: Payer: Self-pay | Admitting: Family Medicine

## 2015-01-09 DIAGNOSIS — R000 Tachycardia, unspecified: Secondary | ICD-10-CM | POA: Diagnosis not present

## 2015-01-09 LAB — B. BURGDORFI ANTIBODIES

## 2015-01-09 LAB — CBC
HEMATOCRIT: 38 % (ref 36.0–46.0)
HEMOGLOBIN: 13 g/dL (ref 12.0–15.0)
MCH: 29.5 pg (ref 26.0–34.0)
MCHC: 34.2 g/dL (ref 30.0–36.0)
MCV: 86.4 fL (ref 78.0–100.0)
PLATELETS: 271 10*3/uL (ref 150–400)
RBC: 4.4 MIL/uL (ref 3.87–5.11)
RDW: 13 % (ref 11.5–15.5)
WBC: 9.7 10*3/uL (ref 4.0–10.5)

## 2015-01-09 LAB — BASIC METABOLIC PANEL
ANION GAP: 6 (ref 5–15)
BUN: 6 mg/dL (ref 6–20)
CHLORIDE: 108 mmol/L (ref 101–111)
CO2: 24 mmol/L (ref 22–32)
Calcium: 8.2 mg/dL — ABNORMAL LOW (ref 8.9–10.3)
Creatinine, Ser: 0.61 mg/dL (ref 0.44–1.00)
GFR calc Af Amer: >60 mL/min (ref >60)
GLUCOSE: 95 mg/dL (ref 65–99)
POTASSIUM: 3.7 mmol/L (ref 3.5–5.1)
Sodium: 138 mmol/L (ref 135–145)

## 2015-01-09 LAB — EBV AB TO VIRAL CAPSID AG PNL, IGG+IGM
EBV VCA IGG: 486 U/mL — AB (ref 0.0–17.9)
EBV VCA IgM: <36 U/mL (ref 0.0–35.9)

## 2015-01-09 MED ORDER — ALPRAZOLAM 0.25 MG PO TABS: 0.2500 mg | ORAL_TABLET | Freq: Two times a day (BID) | ORAL | Status: DC | PRN

## 2015-01-09 MED ORDER — BUTALBITAL-APAP-CAFFEINE 50-325-40 MG PO TABS: 1.0000 | ORAL_TABLET | ORAL | Status: DC | PRN

## 2015-01-09 NOTE — Telephone Encounter (Signed)
Notified pt and she voices understanding. 

## 2015-01-09 NOTE — Discharge Summary (Signed)
Marabeth Melland, is a 44 y.o. female  DOB 1970/10/30  MRN [id number].  Admission date:  01/07/2015  Admitting Physician  Therisa Doyne, MD  Discharge Date:  01/09/2015   Primary MD  Neena Rhymes, MD  Recommendations for primary care physician for things to follow:  - Check labs including CBC, BMP due to next visit - Please follow on B. burgdorferi antibodies result and EBV viral load - Outpatient follow-up on thyroid nodule found on imaging (CT chest)   Admission Diagnosis  Other chest pain [R07.89] Leukocytosis [D72.829] Tachycardia [R00.0]   Discharge Diagnosis  Other chest pain [R07.89] Leukocytosis [D72.829] Tachycardia [R00.0]    Principal Problem:   Tachycardia Active Problems:   HTN (hypertension)   Depression with anxiety   Leukocytosis   Needs sleep apnea assessment      Past Medical History  Diagnosis Date  . Cough   . Sinusitis   . Hypertension   . Hyperlipidemia   . Migraine headache   . Depression   . Asthma     Past Surgical History  Procedure Laterality Date  . Tonsillectomy and adenoidectomy    . Cesarean section      x 3        History of present illness and  Hospital Course:     Kindly see H&P for history of present illness and admission details, please review complete Labs, Consult reports and Test reports for all details in brief  HPI  from the history and physical done on the day of admission on 9/15  Danaka McDonough-Hughes is a 44 y.o. female with Past medical history of hypertension, dyslipidemia, mood disorder. The patient presents with complains of palpitation and not feeling well. Patient mentions that she was visiting [place] 3 weeks ago and was under a lot of stress during that visit. After the visit the patient started having complaints of palpitation. The palpitation. Code while she was driving. Later on that day occurring  throughout the day. She had multiple episodes. During the day associated with the nausea. She also complains of some headache which was frontal as well as on the occipital region. She had some neck rigidity with that happens headache as well. She denies having any fever, photophobia, confusion, fall, trauma, injury, loss of control of bowel or bladder, abdominal pain, diarrhea, constipation, burning urination. She denies having any shortness of breath on exertion as well. With this she went to see her PCP and the pain increases or goes off of Lopressor and also place her on Axid. Despite this medication changes she continues to have the symptoms and therefore decided to come to the hospital. There was initial concern of possible tickborne illness and therefore the patient was given doxycycline. At the time of my evaluation patient categorically denied having any exposure to ticks. She did have been in area where there was more grass but no [facility] or wood trip. She denies having any tick bites, she denies having any rash anywhere.  The patient is coming from home.  At  her baseline ambulates without support And is independent for most of her ADL; manages her medication on her own.  Hospital Course   Tachycardia - Patient reports symptoms of viral illness over the last week, including sore throat,  generalized malaise and body ache, tachycardia most likely related to dehydration and viral illness, as well contributing factor of anxiety, as reports her symptoms are limited due to anxiety. - Sinus tachycardia resolved for hydration with IV fluid. - No significant events on telemetry, heart rate was overall controlled in the 80s and 90s. - EKG nonacute - 2-D echo with EF 60-65%, no diastolic dysfunction, no wall motion abnormality, no pericardial fluid - CTA chest negative for PE(was done giving recent long distance driving to [place])  Leukocytosis - likely stress related, or related to viral  illness - Resolved with hydration  Hypertension - Continue with Lopressor  Depression anxiety - Continue with home medication, patient was given prescription for Xanax as needed, total of 10 tablets.  Incidental finding of thyroid nodule on imaging - TSH done at PCP office 9/12 within normal limits - Outpatient follow-up  Discharge Condition: Stable   Follow UP  Follow-up Information    Follow up with Neena Rhymes, MD. Call in 1 week.   Specialty:  Family Medicine   Why:  Posthospitalization follow-up   Contact information:   [address] [place] [address] [phone]         Discharge Instructions  and  Discharge Medications     Discharge Instructions    Diet - low sodium heart healthy    Complete by:  As directed      Discharge instructions    Complete by:  As directed   Follow with Primary MD Neena Rhymes, MD in 7 days   Get CBC, CMP, checked  by Primary MD next visit.    Activity: As tolerated with Full fall precautions use walker/cane & assistance as needed   Disposition Home    Diet: Heart Healthy  , with feeding assistance and aspiration precautions.  For Heart failure patients - Check your Weight same time everyday, if you gain over 2 pounds, or you develop in leg swelling, experience more shortness of breath or chest pain, call your Primary MD immediately. Follow Cardiac Low Salt Diet and 1.5 lit/day fluid restriction.   On your next visit with your primary care physician please Get Medicines reviewed and adjusted.   Please request your Prim.MD to go over all Hospital Tests and Procedure/Radiological results at the follow up, please get all Hospital records sent to your Prim MD by signing hospital release before you go home.   If you experience worsening of your admission symptoms, develop shortness of breath, life threatening emergency, suicidal or homicidal thoughts you must seek medical attention immediately by  calling 911 or calling your MD immediately  if symptoms less severe.  You Must read complete instructions/literature along with all the possible adverse reactions/side effects for all the Medicines you take and that have been prescribed to you. Take any new Medicines after you have completely understood and accpet all the possible adverse reactions/side effects.   Do not drive, operating heavy machinery, perform activities at heights, swimming or participation in water activities or provide baby sitting services if your were admitted for syncope or siezures until you have seen by Primary MD or a Neurologist and advised to do so again.  Do not drive when taking Pain medications.    Do not  take more than prescribed Pain, Sleep and Anxiety Medications  Special Instructions: If you have smoked or chewed Tobacco  in the last 2 yrs please stop smoking, stop any regular Alcohol  and or any Recreational drug use.  Wear Seat belts while driving.   Please note  You were cared for by a hospitalist during your hospital stay. If you have any questions about your discharge medications or the care you received while you were in the hospital after you are discharged, you can call the unit and asked to speak with the hospitalist on call if the hospitalist that took care of you is not available. Once you are discharged, your primary care physician will handle any further medical issues. Please note that NO REFILLS for any discharge medications will be authorized once you are discharged, as it is imperative that you return to your primary care physician (or establish a relationship with a primary care physician if you do not have one) for your aftercare needs so that they can reassess your need for medications and monitor your lab values.     Increase activity slowly    Complete by:  As directed             Medication List    TAKE these medications        ALPRAZolam 0.25 MG tablet  Commonly known as:   XANAX  Take 1 tablet (0.25 mg total) by mouth 2 (two) times daily as needed for anxiety.     butalbital-acetaminophen-caffeine 50-325-40 MG per tablet  Commonly known as:  FIORICET, ESGIC  Take 1 tablet by mouth every 4 (four) hours as needed for headache.     FLUoxetine 20 MG tablet  Commonly known as:  PROZAC  1/2 tab by mouth once daily for 1 week, then increase to a full tab once daily on week two.     ibuprofen 200 MG tablet  Commonly known as:  ADVIL,MOTRIN  Take 200 mg by mouth every 6 (six) hours as needed for moderate pain.     metoprolol tartrate 25 MG tablet  Commonly known as:  LOPRESSOR  Take 50 mg by mouth 2 (two) times daily.          Diet and Activity recommendation: See Discharge Instructions above   Consults obtained -  None   Major procedures and Radiology Reports - PLEASE review detailed and final reports for all details, in brief -      Ct Angio Chest Pe W/cm &/or Wo Cm  01/07/2015   CLINICAL DATA:  Tachycardia 1 week with chest heaviness, dizziness and nausea. Recent travel 3-4 weeks ago.  EXAM: CT ANGIOGRAPHY CHEST WITH CONTRAST  TECHNIQUE: Multidetector CT imaging of the chest was performed using the standard protocol during bolus administration of intravenous contrast. Multiplanar CT image reconstructions and MIPs were obtained to evaluate the vascular anatomy.  CONTRAST:  OMNIPAQUE IOHEXOL 350 MG/ML SOLN  COMPARISON:  Chest x-ray 06/20/2011  FINDINGS: Lungs are well inflated without consolidation or effusion. Airways are within normal.  Heart is within normal in size. Pulmonary arterial system is normal without evidence of emboli. There is no mediastinal, hilar or axillary adenopathy. Remaining mediastinal structures are within normal. There is a 9 mm hypodense nodule over the right lobe of the thyroid gland.  Images through the upper abdomen demonstrate mild prominence of the spleen. There are mild degenerative changes of the spine.  Review of the  MIP images confirms the above findings.  IMPRESSION: No  acute cardiopulmonary disease and no evidence of pulmonary embolism.  9 mm hypodense nodule over the right lobe of the thyroid likely a cyst or adenoma.   Electronically Signed   By: Elberta Fortis M.D.   On: 01/07/2015 21:14    Micro Results     Recent Results (from the past 240 hour(s))  Rapid strep screen     Status: None   Collection Time: 01/07/15 10:55 PM  Result Value Ref Range Status   Streptococcus, Group A Screen (Direct) NEGATIVE NEGATIVE Final    Comment: (NOTE) A Rapid Antigen test may result negative if the antigen level in the sample is below the detection level of this test. The FDA has not cleared this test as a stand-alone test therefore the rapid antigen negative result has reflexed to a Group A Strep culture.        Today   Subjective:   Cadynce McDonough-Hughes today has no headache,no chest abdominal pain,no new weakness tingling or numbness, feels much better wants to go home today. Denies any further palpitation yesterday or overnight  Objective:   Blood pressure 133/70, pulse 85, temperature 98.1 F (36.7 C), temperature source Oral, resp. rate 16, height 5\' 4"  (1.626 m), weight 109.317 kg (241 lb), last menstrual period 12/15/2014, SpO2 100 %.   Intake/Output Summary (Last 24 hours) at 01/09/15 0942 Last data filed at 01/09/15 0907  Gross per 24 hour  Intake   2630 ml  Output   5200 ml  Net  -2570 ml    Exam Awake Alert, Oriented x 3, No new F.N deficits, Normal affect [place].AT,PERRAL Supple Neck,No JVD, No cervical lymphadenopathy appriciated.  Symmetrical Chest wall movement, Good air movement bilaterally, CTAB RRR,No Gallops,Rubs or new Murmurs, No Parasternal Heave +ve B.Sounds, Abd Soft, Non tender, No organomegaly appriciated, No rebound -guarding or rigidity. No Cyanosis, Clubbing or edema, No new Rash or bruise  Data Review   CBC w Diff: Lab Results  Component Value Date   WBC  9.7 01/09/2015   HGB 13.0 01/09/2015   HCT 38.0 01/09/2015   PLT 271 01/09/2015   LYMPHOPCT 26 01/08/2015   MONOPCT 9 01/08/2015   EOSPCT 3 01/08/2015   BASOPCT 0 01/08/2015    CMP: Lab Results  Component Value Date   NA 138 01/09/2015   K 3.7 01/09/2015   CL 108 01/09/2015   CO2 24 01/09/2015   BUN 6 01/09/2015   CREATININE 0.61 01/09/2015   PROT 5.9* 01/08/2015   ALBUMIN 3.5 01/08/2015   BILITOT 0.8 01/08/2015   ALKPHOS 54 01/08/2015   AST 17 01/08/2015   ALT 19 01/08/2015  .   Total Time in preparing paper work, data evaluation and todays exam - 35 minutes  ELGERGAWY, DAWOOD M.D on 01/09/2015 at 9:42 AM  [facility]   Office  [phone]

## 2015-01-09 NOTE — Discharge Instructions (Signed)
Follow with Primary MD Katherine Tabori, MD in 7 days  ° °Get CBC, CMP, checked  by Primary MD next visit.  ° ° °Activity: As tolerated with Full fall precautions use walker/cane & assistance as needed ° ° °Disposition Home  ° ° °Diet: Heart Healthy  , with feeding assistance and aspiration precautions. ° °For Heart failure patients - Check your Weight same time everyday, if you gain over 2 pounds, or you develop in leg swelling, experience more shortness of breath or chest pain, call your Primary MD immediately. Follow Cardiac Low Salt Diet and 1.5 lit/day fluid restriction. ° ° °On your next visit with your primary care physician please Get Medicines reviewed and adjusted. ° ° °Please request your Prim.MD to go over all Hospital Tests and Procedure/Radiological results at the follow up, please get all Hospital records sent to your Prim MD by signing hospital release before you go home. ° ° °If you experience worsening of your admission symptoms, develop shortness of breath, life threatening emergency, suicidal or homicidal thoughts you must seek medical attention immediately by calling 911 or calling your MD immediately  if symptoms less severe. ° °You Must read complete instructions/literature along with all the possible adverse reactions/side effects for all the Medicines you take and that have been prescribed to you. Take any new Medicines after you have completely understood and accpet all the possible adverse reactions/side effects.  ° °Do not drive, operating heavy machinery, perform activities at heights, swimming or participation in water activities or provide baby sitting services if your were admitted for syncope or siezures until you have seen by Primary MD or a Neurologist and advised to do so again. ° °Do not drive when taking Pain medications.  ° ° °Do not take more than prescribed Pain, Sleep and Anxiety Medications ° °Special Instructions: If you have smoked or chewed Tobacco  in the last 2 yrs  please stop smoking, stop any regular Alcohol  and or any Recreational drug use. ° °Wear Seat belts while driving. ° ° °Please note ° °You were cared for by a hospitalist during your hospital stay. If you have any questions about your discharge medications or the care you received while you were in the hospital after you are discharged, you can call the unit and asked to speak with the hospitalist on call if the hospitalist that took care of you is not available. Once you are discharged, your primary care physician will handle any further medical issues. Please note that NO REFILLS for any discharge medications will be authorized once you are discharged, as it is imperative that you return to your primary care physician (or establish a relationship with a primary care physician if you do not have one) for your aftercare needs so that they can reassess your need for medications and monitor your lab values. ° °

## 2015-01-09 NOTE — Telephone Encounter (Signed)
Changed appointment to 9/28 and patient notified.

## 2015-01-09 NOTE — Telephone Encounter (Signed)
Caller name: Fifi Schindler  Relationship to patient: self  Can be reached: [phone] Pharmacy:  Reason for call: pt called in to see if her hospital fu can be done with her original appt scheduled on 9/28 instead of 9/23. Pt says that she will be out of town on the day of her hospital fu.     Pt is requesting a call back to confirm.

## 2015-01-09 NOTE — Progress Notes (Signed)
Orders received for pt discharge.  Discharge summary printed and reviewed with pt.  Explained medication regimen, and pt had no further questions at this time.  IV removed and site remains clean, dry, intact.  Telemetry removed.  Pt in stable condition and awaiting transport. 

## 2015-01-11 LAB — CULTURE, GROUP A STREP: Strep A Culture: NEGATIVE

## 2015-01-12 ENCOUNTER — Telehealth: Payer: Self-pay | Admitting: *Deleted

## 2015-01-12 NOTE — Telephone Encounter (Signed)
Transition Care Management Follow-up Telephone Call   Date discharged? 01/09/15    How have you been since you were released from the hospital? No palpitations, shortness of breath, chest pains, dizziness.  [place] the beach.    Do you understand why you were in the hospital? yes   Do you understand the discharge instructions? yes   Where were you discharged to? Home   Items Reviewed:  Medications reviewed: yes  Allergies reviewed: yes  Dietary changes reviewed: yes  Referrals reviewed: Yes   Functional Questionnaire:   Activities of Daily Living (ADLs):   She states they are independent in the following: ambulation, bathing and hygiene, feeding, continence, grooming, toileting and dressing States they require assistance with the following: none   Any transportation issues/concerns?: no   Any patient concerns? Yes- found a nodule on thyroid (thinks is benign) but wants that to be followed-up   Confirmed importance and date/time of follow-up visits scheduled Yes  Confirmed with patient if condition begins to worsen call PCP or go to the ER.  Patient was given the office number and encouraged to call back with question or concerns.  : Yes

## 2015-01-13 LAB — CULTURE, BLOOD (ROUTINE X 2)
Culture: NO GROWTH
Culture: NO GROWTH

## 2015-01-13 NOTE — Telephone Encounter (Signed)
Note forward to Dr. Beverely Low.  Hospital follow up scheduled for 01/21/15 at 11 am with Dr. Beverely Low.

## 2015-01-16 ENCOUNTER — Ambulatory Visit: Payer: Managed Care, Other (non HMO) | Admitting: Family Medicine

## 2015-01-21 ENCOUNTER — Ambulatory Visit (INDEPENDENT_AMBULATORY_CARE_PROVIDER_SITE_OTHER): Payer: Managed Care, Other (non HMO) | Admitting: Family Medicine

## 2015-01-21 ENCOUNTER — Encounter: Payer: Self-pay | Admitting: Family Medicine

## 2015-01-21 ENCOUNTER — Ambulatory Visit: Payer: Managed Care, Other (non HMO) | Admitting: Family Medicine

## 2015-01-21 VITALS — BP 130/78 | HR 75 | Temp 98.0°F | Resp 16 | Ht 63.0 in | Wt 237.0 lb

## 2015-01-21 DIAGNOSIS — F411 Generalized anxiety disorder: Secondary | ICD-10-CM

## 2015-01-21 DIAGNOSIS — R000 Tachycardia, unspecified: Secondary | ICD-10-CM | POA: Diagnosis not present

## 2015-01-21 DIAGNOSIS — E041 Nontoxic single thyroid nodule: Secondary | ICD-10-CM

## 2015-01-21 LAB — CBC WITH DIFFERENTIAL/PLATELET
BASOS ABS: 0.1 10*3/uL (ref 0.0–0.1)
BASOS PCT: 0.5 % (ref 0.0–3.0)
EOS ABS: 0.3 10*3/uL (ref 0.0–0.7)
Eosinophils Relative: 2.7 % (ref 0.0–5.0)
HEMATOCRIT: 41 % (ref 36.0–46.0)
HEMOGLOBIN: 14 g/dL (ref 12.0–15.0)
LYMPHS PCT: 22.1 % (ref 12.0–46.0)
Lymphs Abs: 2.7 10*3/uL (ref 0.7–4.0)
MCHC: 34.1 g/dL (ref 30.0–36.0)
MCV: 85 fl (ref 78.0–100.0)
MONO ABS: 0.9 10*3/uL (ref 0.1–1.0)
Monocytes Relative: 7.3 % (ref 3.0–12.0)
Neutro Abs: 8.2 10*3/uL — ABNORMAL HIGH (ref 1.4–7.7)
Neutrophils Relative %: 67.4 % (ref 43.0–77.0)
Platelets: 392 10*3/uL (ref 150.0–400.0)
RBC: 4.83 Mil/uL (ref 3.87–5.11)
RDW: 12.7 % (ref 11.5–15.5)
WBC: 12.2 10*3/uL — AB (ref 4.0–10.5)

## 2015-01-21 LAB — BASIC METABOLIC PANEL
BUN: 12 mg/dL (ref 6–23)
CALCIUM: 9.5 mg/dL (ref 8.4–10.5)
CHLORIDE: 101 meq/L (ref 96–112)
CO2: 30 mEq/L (ref 19–32)
CREATININE: 0.63 mg/dL (ref 0.40–1.20)
GFR: 108.21 mL/min (ref >60.00)
Glucose, Bld: 98 mg/dL (ref 70–99)
Potassium: 4.1 mEq/L (ref 3.5–5.1)
Sodium: 138 mEq/L (ref 135–145)

## 2015-01-21 MED ORDER — ALPRAZOLAM 0.25 MG PO TABS: 0.2500 mg | ORAL_TABLET | Freq: Two times a day (BID) | ORAL | Status: DC | PRN

## 2015-01-21 MED ORDER — METOPROLOL TARTRATE 25 MG PO TABS: 50.0000 mg | ORAL_TABLET | Freq: Two times a day (BID) | ORAL | Status: DC

## 2015-01-21 NOTE — Assessment & Plan Note (Signed)
Improved since last visit.  On Prozac daily.  Asking for refill on Alprazolam.  Script provided.  Discussed need for healthy diet/regular exercise.  Will continue to follow.

## 2015-01-21 NOTE — Assessment & Plan Note (Signed)
New.  Found coincidently on CTA during hospitalization.  Will order [place] to assess thyroid and determine next steps.  Pt expressed understanding and is in agreement w/ plan.

## 2015-01-21 NOTE — Assessment & Plan Note (Signed)
New.  Noted on labs done during recent hospitalization.  Repeat labs today.

## 2015-01-21 NOTE — Assessment & Plan Note (Signed)
Resolved.  Pt reports heart is no longer racing but she continues to have bounding pulse prior to bed- which she feels is likely anxiety related.  Will continue to follow at future visits.

## 2015-01-21 NOTE — Patient Instructions (Signed)
Follow up by phone of MyChart in 2 weeks to let me know how the anxiety is doing We'll notify you of your lab results and make any changes if needed Continue to work on stress management- art, music, exercise, whatever your outlet is! Call with any questions or concerns Hang in there!!!

## 2015-01-21 NOTE — Progress Notes (Signed)
   Subjective:    Patient ID: Kayla Combs, female    DOB: 16-Jun-1970, 44 y.o.   MRN: [id number]  HPI Hospital f/u- pt was admitted 9/14-16 w/ tachycardia and generalized malaise.  Had negative CTA due to recent trip home from [place].  Normal ECHO.  On CTA, they did find a coincidental R thyroid nodule measuring 0.10mm and recommended f/u.  Pt reports she thinks this whole episode was stress related.  Pt is again stressed b/c parents are coming for visit this weekend.  Pt was started on Alprazolam to use as needed.  Has used 'a couple of times and it has helped'.  Will still having bounding heart beat- not racing.  Taking  Prozac- 'my overall attitude is better'.  Did have mildly low Ca while hospitalized.   Review of Systems For ROS see HPI     Objective:   Physical Exam  Constitutional: She is oriented to person, place, and time. She appears well-developed and well-nourished. No distress.  obese  HENT:  Head: Normocephalic and atraumatic.  Eyes: Conjunctivae and EOM are normal. Pupils are equal, round, and reactive to light.  Neck: Normal range of motion. Neck supple. No thyromegaly present.  Cardiovascular: Normal rate, regular rhythm, normal heart sounds and intact distal pulses.   No murmur heard. Pulmonary/Chest: Effort normal and breath sounds normal. No respiratory distress.  Abdominal: Soft. She exhibits no distension. There is no tenderness.  Musculoskeletal: She exhibits no edema.  Lymphadenopathy:    She has no cervical adenopathy.  Neurological: She is alert and oriented to person, place, and time.  Skin: Skin is warm and dry.  Psychiatric: She has a normal mood and affect. Her behavior is normal.  Vitals reviewed.         Assessment & Plan:

## 2015-01-21 NOTE — Progress Notes (Signed)
Pre visit review using our clinic review tool, if applicable. No additional management support is needed unless otherwise documented below in the visit note. 

## 2015-01-23 ENCOUNTER — Other Ambulatory Visit: Payer: Self-pay | Admitting: General Practice

## 2015-01-23 ENCOUNTER — Encounter: Payer: Self-pay | Admitting: Family Medicine

## 2015-01-23 MED ORDER — FLUOXETINE HCL 20 MG PO TABS: ORAL_TABLET | ORAL | Status: DC

## 2015-01-29 ENCOUNTER — Encounter: Payer: Self-pay | Admitting: Family Medicine

## 2015-02-03 ENCOUNTER — Encounter: Payer: Self-pay | Admitting: Family Medicine

## 2015-02-03 ENCOUNTER — Ambulatory Visit (HOSPITAL_BASED_OUTPATIENT_CLINIC_OR_DEPARTMENT_OTHER)
Admission: RE | Admit: 2015-02-03 | Discharge: 2015-02-03 | Disposition: A | Discharge status: Home / Self Care | Payer: Managed Care, Other (non HMO) | Source: Ambulatory Visit | Attending: Family Medicine | Admitting: Family Medicine

## 2015-02-03 DIAGNOSIS — E041 Nontoxic single thyroid nodule: Secondary | ICD-10-CM

## 2015-02-03 DIAGNOSIS — E010 Iodine-deficiency related diffuse (endemic) goiter: Secondary | ICD-10-CM | POA: Diagnosis present

## 2015-03-30 ENCOUNTER — Encounter: Payer: Self-pay | Admitting: Family Medicine

## 2015-04-06 ENCOUNTER — Other Ambulatory Visit: Payer: Self-pay | Admitting: General Practice

## 2015-04-06 ENCOUNTER — Encounter: Payer: Self-pay | Admitting: Family Medicine

## 2015-04-06 ENCOUNTER — Ambulatory Visit (INDEPENDENT_AMBULATORY_CARE_PROVIDER_SITE_OTHER): Payer: BLUE CROSS/BLUE SHIELD | Admitting: Family Medicine

## 2015-04-06 VITALS — BP 126/76 | HR 72 | Temp 98.5°F | Resp 16 | Ht 63.0 in | Wt 241.2 lb

## 2015-04-06 DIAGNOSIS — Z0000 Encounter for general adult medical examination without abnormal findings: Secondary | ICD-10-CM | POA: Diagnosis not present

## 2015-04-06 DIAGNOSIS — Z1231 Encounter for screening mammogram for malignant neoplasm of breast: Secondary | ICD-10-CM

## 2015-04-06 DIAGNOSIS — Z23 Encounter for immunization: Secondary | ICD-10-CM

## 2015-04-06 LAB — HEPATIC FUNCTION PANEL
ALBUMIN: 4.6 g/dL (ref 3.5–5.2)
ALT: 22 U/L (ref 0–35)
AST: 17 U/L (ref 0–37)
Alkaline Phosphatase: 71 U/L (ref 39–117)
BILIRUBIN DIRECT: 0.1 mg/dL (ref 0.0–0.3)
TOTAL PROTEIN: 7.7 g/dL (ref 6.0–8.3)
Total Bilirubin: 0.8 mg/dL (ref 0.2–1.2)

## 2015-04-06 LAB — CBC WITH DIFFERENTIAL/PLATELET
BASOS ABS: 0.1 10*3/uL (ref 0.0–0.1)
Basophils Relative: 0.6 % (ref 0.0–3.0)
EOS ABS: 0.4 10*3/uL (ref 0.0–0.7)
Eosinophils Relative: 3.5 % (ref 0.0–5.0)
HEMATOCRIT: 45.5 % (ref 36.0–46.0)
HEMOGLOBIN: 15.3 g/dL — AB (ref 12.0–15.0)
LYMPHS PCT: 17.6 % (ref 12.0–46.0)
Lymphs Abs: 2.2 10*3/uL (ref 0.7–4.0)
MCHC: 33.5 g/dL (ref 30.0–36.0)
MCV: 86.1 fl (ref 78.0–100.0)
MONOS PCT: 6.2 % (ref 3.0–12.0)
Monocytes Absolute: 0.8 10*3/uL (ref 0.1–1.0)
Neutro Abs: 8.8 10*3/uL — ABNORMAL HIGH (ref 1.4–7.7)
Neutrophils Relative %: 72.1 % (ref 43.0–77.0)
Platelets: 422 10*3/uL — ABNORMAL HIGH (ref 150.0–400.0)
RBC: 5.28 Mil/uL — AB (ref 3.87–5.11)
RDW: 12.9 % (ref 11.5–15.5)
WBC: 12.3 10*3/uL — AB (ref 4.0–10.5)

## 2015-04-06 LAB — TSH: TSH: 2.26 u[IU]/mL (ref 0.35–4.50)

## 2015-04-06 LAB — LIPID PANEL
Cholesterol: 221 mg/dL — ABNORMAL HIGH (ref 0–200)
HDL: 47.8 mg/dL (ref >39.00)
NonHDL: 173.08
TRIGLYCERIDES: 222 mg/dL — AB (ref 0.0–149.0)
Total CHOL/HDL Ratio: 5
VLDL: 44.4 mg/dL — AB (ref 0.0–40.0)

## 2015-04-06 LAB — BASIC METABOLIC PANEL
BUN: 17 mg/dL (ref 6–23)
CALCIUM: 9.9 mg/dL (ref 8.4–10.5)
CO2: 29 mEq/L (ref 19–32)
CREATININE: 0.75 mg/dL (ref 0.40–1.20)
Chloride: 101 mEq/L (ref 96–112)
GFR: 88.41 mL/min (ref >60.00)
GLUCOSE: 100 mg/dL — AB (ref 70–99)
Potassium: 4.2 mEq/L (ref 3.5–5.1)
SODIUM: 139 meq/L (ref 135–145)

## 2015-04-06 LAB — VITAMIN D 25 HYDROXY (VIT D DEFICIENCY, FRACTURES): VITD: 26.1 ng/mL — ABNORMAL LOW (ref 30.00–100.00)

## 2015-04-06 LAB — LDL CHOLESTEROL, DIRECT: LDL DIRECT: 138 mg/dL

## 2015-04-06 MED ORDER — METOPROLOL TARTRATE 25 MG PO TABS: 50.0000 mg | ORAL_TABLET | Freq: Two times a day (BID) | ORAL | Status: DC

## 2015-04-06 MED ORDER — VITAMIN D (ERGOCALCIFEROL) 1.25 MG (50000 UNIT) PO CAPS: 50000.0000 [IU] | ORAL_CAPSULE | ORAL | Status: DC

## 2015-04-06 MED ORDER — FLUOXETINE HCL 20 MG PO TABS: ORAL_TABLET | ORAL | Status: DC

## 2015-04-06 MED ORDER — ALPRAZOLAM 0.25 MG PO TABS: 0.2500 mg | ORAL_TABLET | Freq: Two times a day (BID) | ORAL | Status: DC | PRN

## 2015-04-06 NOTE — Patient Instructions (Signed)
Follow up in 6 months to recheck BP and cholesterol We'll notify you of your lab results and make any changes if needed Try and get back into your exercise and healthy food choices- you can do it! We'll call you with your mammogram appt Call with any questions or concerns If you want to join us at the new [place] office, any scheduled appointments will automatically transfer and we will see you at [address], [place], [place] [id number] (OPENING 04/28/15) Happy Holidays!!!

## 2015-04-06 NOTE — Assessment & Plan Note (Signed)
Ongoing issue for pt.  She continues to gain weight.  Stressed the need for healthy diet and regular exercise.  Will continue to follow.

## 2015-04-06 NOTE — Progress Notes (Signed)
Pre visit review using our clinic review tool, if applicable. No additional management support is needed unless otherwise documented below in the visit note. 

## 2015-04-06 NOTE — Assessment & Plan Note (Signed)
Pt's PE WNL w/ exception of obesity.  UTD on gyn.  Due for mammo- order entered.  Flu shot given.  Check labs.  Anticipatory guidance provided.

## 2015-04-06 NOTE — Progress Notes (Signed)
   Subjective:    Patient ID: Kayla Combs, female    DOB: 1971/02/25, 44 y.o.   MRN: [id number]  HPI CPE- UTD on GYN, due for mammo ([facility]).  Due for flu.     Review of Systems Patient reports no vision/ hearing changes, adenopathy,fever, weight change,  persistant/recurrent hoarseness , swallowing issues, chest pain, palpitations, edema, persistant/recurrent cough, hemoptysis, dyspnea (rest/exertional/paroxysmal nocturnal), gastrointestinal bleeding (melena, rectal bleeding), abdominal pain, significant heartburn, bowel changes, GU symptoms (dysuria, hematuria, incontinence), Gyn symptoms (abnormal  bleeding, pain),  syncope, focal weakness, memory loss, numbness & tingling, skin/hair/nail changes, abnormal bruising or bleeding, anxiety, or depression.   + hot flashes    Objective:   Physical Exam General Appearance:    Alert, cooperative, no distress, appears stated age, obese  Head:    Normocephalic, without obvious abnormality, atraumatic  Eyes:    PERRL, conjunctiva/corneas clear, EOM's intact, fundi    benign, both eyes  Ears:    Normal TM's and external ear canals, both ears  Nose:   Nares normal, septum midline, mucosa normal, no drainage    or sinus tenderness  Throat:   Lips, mucosa, and tongue normal; teeth and gums normal  Neck:   Supple, symmetrical, trachea midline, no adenopathy;    Thyroid: no enlargement/tenderness/nodules  Back:     Symmetric, no curvature, ROM normal, no CVA tenderness  Lungs:     Clear to auscultation bilaterally, respirations unlabored  Chest Wall:    No tenderness or deformity   Heart:    Regular rate and rhythm, S1 and S2 normal, no murmur, rub   or gallop  Breast Exam:    Deferred to GYN  Abdomen:     Soft, non-tender, bowel sounds active all four quadrants,    no masses, no organomegaly  Genitalia:    Deferred to GYN  Rectal:    Extremities:   Extremities normal, atraumatic, no cyanosis or edema  Pulses:   2+ and  symmetric all extremities  Skin:   Skin color, texture, turgor normal, no rashes or lesions  Lymph nodes:   Cervical, supraclavicular, and axillary nodes normal  Neurologic:   CNII-XII intact, normal strength, sensation and reflexes    throughout          Assessment & Plan:

## 2015-10-06 ENCOUNTER — Encounter: Payer: Self-pay | Admitting: Family Medicine

## 2015-10-06 ENCOUNTER — Ambulatory Visit (INDEPENDENT_AMBULATORY_CARE_PROVIDER_SITE_OTHER): Payer: BLUE CROSS/BLUE SHIELD | Admitting: Family Medicine

## 2015-10-06 ENCOUNTER — Other Ambulatory Visit (INDEPENDENT_AMBULATORY_CARE_PROVIDER_SITE_OTHER): Payer: BLUE CROSS/BLUE SHIELD

## 2015-10-06 VITALS — BP 121/80 | HR 87 | Temp 98.1°F | Resp 16 | Ht 63.0 in | Wt 250.2 lb

## 2015-10-06 DIAGNOSIS — E781 Pure hyperglyceridemia: Secondary | ICD-10-CM

## 2015-10-06 DIAGNOSIS — I10 Essential (primary) hypertension: Secondary | ICD-10-CM | POA: Diagnosis not present

## 2015-10-06 LAB — LIPID PANEL
Cholesterol: 202 mg/dL — ABNORMAL HIGH (ref 0–200)
HDL: 40.1 mg/dL (ref >39.00)
NonHDL: 162.32
Total CHOL/HDL Ratio: 5
Triglycerides: 211 mg/dL — ABNORMAL HIGH (ref 0.0–149.0)
VLDL: 42.2 mg/dL — ABNORMAL HIGH (ref 0.0–40.0)

## 2015-10-06 LAB — CBC WITH DIFFERENTIAL/PLATELET
Basophils Absolute: 0.1 10*3/uL (ref 0.0–0.1)
Basophils Relative: 0.6 % (ref 0.0–3.0)
EOS ABS: 0.4 10*3/uL (ref 0.0–0.7)
EOS PCT: 3.4 % (ref 0.0–5.0)
HCT: 41.7 % (ref 36.0–46.0)
Hemoglobin: 14.2 g/dL (ref 12.0–15.0)
Lymphocytes Relative: 18.9 % (ref 12.0–46.0)
Lymphs Abs: 2.2 10*3/uL (ref 0.7–4.0)
MCHC: 34 g/dL (ref 30.0–36.0)
MCV: 84.9 fl (ref 78.0–100.0)
MONO ABS: 0.7 10*3/uL (ref 0.1–1.0)
Monocytes Relative: 6.3 % (ref 3.0–12.0)
Neutro Abs: 8.2 10*3/uL — ABNORMAL HIGH (ref 1.4–7.7)
Neutrophils Relative %: 70.8 % (ref 43.0–77.0)
Platelets: 369 10*3/uL (ref 150.0–400.0)
RBC: 4.92 Mil/uL (ref 3.87–5.11)
RDW: 13.4 % (ref 11.5–15.5)
WBC: 11.6 10*3/uL — AB (ref 4.0–10.5)

## 2015-10-06 LAB — BASIC METABOLIC PANEL
BUN: 13 mg/dL (ref 6–23)
CO2: 26 mEq/L (ref 19–32)
CREATININE: 0.64 mg/dL (ref 0.40–1.20)
Calcium: 9.5 mg/dL (ref 8.4–10.5)
Chloride: 103 mEq/L (ref 96–112)
GFR: 105.93 mL/min (ref >60.00)
GLUCOSE: 95 mg/dL (ref 70–99)
Potassium: 4.4 mEq/L (ref 3.5–5.1)
Sodium: 141 mEq/L (ref 135–145)

## 2015-10-06 LAB — HEPATIC FUNCTION PANEL
ALBUMIN: 4.4 g/dL (ref 3.5–5.2)
ALT: 25 U/L (ref 0–35)
AST: 20 U/L (ref 0–37)
Alkaline Phosphatase: 61 U/L (ref 39–117)
Bilirubin, Direct: 0.1 mg/dL (ref 0.0–0.3)
Total Bilirubin: 0.8 mg/dL (ref 0.2–1.2)
Total Protein: 7.2 g/dL (ref 6.0–8.3)

## 2015-10-06 LAB — LDL CHOLESTEROL, DIRECT: LDL DIRECT: 130 mg/dL

## 2015-10-06 MED ORDER — ZOLPIDEM TARTRATE 10 MG PO TABS: 10.0000 mg | ORAL_TABLET | Freq: Every evening | ORAL | Status: DC | PRN

## 2015-10-06 NOTE — Patient Instructions (Signed)
Schedule your complete physical in 6 months Please schedule a fasting lab visit at the [facility] location closest to you Try and get back on track w/ healthy diet and regular exercise- you can do it!! Call with any questions or concerns Have a great trip to [place]!!!

## 2015-10-06 NOTE — Progress Notes (Signed)
Pre visit review using our clinic review tool, if applicable. No additional management support is needed unless otherwise documented below in the visit note. 

## 2015-10-06 NOTE — Assessment & Plan Note (Signed)
Chronic problem.  Pt was attempting to control w/ healthy diet and regular exercise but admits to not doing either.  Check labs.  Start meds prn.

## 2015-10-06 NOTE — Assessment & Plan Note (Signed)
Deteriorated.  Pt has gained another 9 lbs.  Stressed need for healthy diet and regular exercise- pt is currently doing neither.  She is aware that she needs to do better.  Will continue to follow.

## 2015-10-06 NOTE — Assessment & Plan Note (Signed)
Chronic problem.  Well controlled today- asymptomatic.  Check labs.  No anticipated med changes.  Will follow

## 2015-10-06 NOTE — Progress Notes (Signed)
   Subjective:    Patient ID: Kayla Combs, female    DOB: January 02, 1971, 45 y.o.   MRN: [id number]  HPI HTN- chronic problem, well controlled.  On Metoprolol.  No CP, SOB, HAs, visual changes, edema.  Hyperlipidemia- chronic problem, was attempting to control w/ healthy diet and regular exercise but she has gained 9 lbs since last visit.  Denies abd pain, N/V.  Obesity- chronic problem, pt has gained 9 lbs since last visit.  No regular exercise.  Not following particular diet.   Review of Systems For ROS see HPI     Objective:   Physical Exam  Constitutional: She is oriented to person, place, and time. She appears well-developed and well-nourished. No distress.  obese  HENT:  Head: Normocephalic and atraumatic.  Eyes: Conjunctivae and EOM are normal. Pupils are equal, round, and reactive to light.  Neck: Normal range of motion. Neck supple. No thyromegaly present.  Cardiovascular: Normal rate, regular rhythm, normal heart sounds and intact distal pulses.   No murmur heard. Pulmonary/Chest: Effort normal and breath sounds normal. No respiratory distress.  Abdominal: Soft. She exhibits no distension. There is no tenderness.  Musculoskeletal: She exhibits no edema.  Lymphadenopathy:    She has no cervical adenopathy.  Neurological: She is alert and oriented to person, place, and time.  Skin: Skin is warm and dry.  Psychiatric: She has a normal mood and affect. Her behavior is normal.  Vitals reviewed.         Assessment & Plan:

## 2015-10-09 ENCOUNTER — Other Ambulatory Visit: Payer: Self-pay | Admitting: Family Medicine

## 2015-10-09 NOTE — Telephone Encounter (Signed)
Medication filled to pharmacy as requested.   

## 2015-10-12 ENCOUNTER — Encounter: Payer: Self-pay | Admitting: Family Medicine

## 2015-10-12 MED ORDER — METOPROLOL TARTRATE 25 MG PO TABS: ORAL_TABLET | ORAL | Status: DC

## 2015-10-12 MED ORDER — FLUOXETINE HCL 20 MG PO CAPS
20.0000 mg | ORAL_CAPSULE | Freq: Every day | ORAL | Status: DC
Start: 2015-10-12 — End: 2016-04-07

## 2015-10-12 NOTE — Telephone Encounter (Signed)
Medication filled to pharmacy as requested.   

## 2015-12-07 LAB — BASIC METABOLIC PANEL
BUN: 13 mg/dL (ref 4–21)
Creatinine: 0.9 mg/dL (ref 0.5–1.1)
Glucose: 104 mg/dL
POTASSIUM: 5 mmol/L (ref 3.4–5.3)
SODIUM: 143 mmol/L (ref 137–147)

## 2015-12-07 LAB — CBC AND DIFFERENTIAL
HEMATOCRIT: 37 % (ref 36–46)
HEMOGLOBIN: 12 g/dL (ref 12.0–16.0)
Neutrophils Absolute: 4 /uL
Platelets: 283 10*3/uL (ref 150–399)
WBC: 6.4 10^3/mL

## 2015-12-07 LAB — HEPATIC FUNCTION PANEL
ALT: 20 U/L (ref 7–35)
AST: 16 U/L (ref 13–35)
Alkaline Phosphatase: 81 U/L (ref 25–125)
Bilirubin, Total: 0.4 mg/dL

## 2015-12-07 LAB — LIPID PANEL
CHOLESTEROL: 189 mg/dL (ref 0–200)
HDL: 36 mg/dL (ref 35–70)
LDL Cholesterol: 121 mg/dL
LDl/HDL Ratio: 5.3
Triglycerides: 239 mg/dL — AB (ref 40–160)

## 2015-12-07 LAB — TSH: TSH: 1.88 u[IU]/mL (ref 0.41–5.90)

## 2016-04-07 ENCOUNTER — Ambulatory Visit (INDEPENDENT_AMBULATORY_CARE_PROVIDER_SITE_OTHER): Payer: BLUE CROSS/BLUE SHIELD | Admitting: Family Medicine

## 2016-04-07 ENCOUNTER — Encounter: Payer: Self-pay | Admitting: Family Medicine

## 2016-04-07 ENCOUNTER — Encounter: Payer: Self-pay | Admitting: General Practice

## 2016-04-07 VITALS — BP 121/82 | HR 74 | Temp 98.1°F | Resp 16 | Ht 63.0 in | Wt 254.4 lb

## 2016-04-07 DIAGNOSIS — Z0000 Encounter for general adult medical examination without abnormal findings: Secondary | ICD-10-CM

## 2016-04-07 DIAGNOSIS — H9190 Unspecified hearing loss, unspecified ear: Secondary | ICD-10-CM

## 2016-04-07 LAB — TSH: TSH: 1.32 u[IU]/mL (ref 0.35–4.50)

## 2016-04-07 LAB — BASIC METABOLIC PANEL
BUN: 11 mg/dL (ref 6–23)
CALCIUM: 9.8 mg/dL (ref 8.4–10.5)
CHLORIDE: 103 meq/L (ref 96–112)
CO2: 32 meq/L (ref 19–32)
CREATININE: 0.65 mg/dL (ref 0.40–1.20)
GFR: 103.83 mL/min (ref >60.00)
GLUCOSE: 104 mg/dL — AB (ref 70–99)
Potassium: 5 mEq/L (ref 3.5–5.1)
Sodium: 140 mEq/L (ref 135–145)

## 2016-04-07 LAB — HEPATIC FUNCTION PANEL
ALT: 21 U/L (ref 0–35)
AST: 17 U/L (ref 0–37)
Albumin: 4.6 g/dL (ref 3.5–5.2)
Alkaline Phosphatase: 75 U/L (ref 39–117)
BILIRUBIN TOTAL: 0.8 mg/dL (ref 0.2–1.2)
Bilirubin, Direct: 0.1 mg/dL (ref 0.0–0.3)
TOTAL PROTEIN: 7.2 g/dL (ref 6.0–8.3)

## 2016-04-07 LAB — LIPID PANEL
CHOLESTEROL: 183 mg/dL (ref 0–200)
HDL: 39.3 mg/dL (ref >39.00)
LDL Cholesterol: 105 mg/dL — ABNORMAL HIGH (ref 0–99)
NonHDL: 143.38
TRIGLYCERIDES: 190 mg/dL — AB (ref 0.0–149.0)
Total CHOL/HDL Ratio: 5
VLDL: 38 mg/dL (ref 0.0–40.0)

## 2016-04-07 LAB — VITAMIN D 25 HYDROXY (VIT D DEFICIENCY, FRACTURES): VITD: 30.56 ng/mL (ref 30.00–100.00)

## 2016-04-07 MED ORDER — METOPROLOL TARTRATE 25 MG PO TABS: ORAL_TABLET | ORAL | 1 refills | Status: DC

## 2016-04-07 MED ORDER — BUTALBITAL-APAP-CAFFEINE 50-325-40 MG PO TABS: 1.0000 | ORAL_TABLET | ORAL | 0 refills | Status: DC | PRN

## 2016-04-07 MED ORDER — FLUOXETINE HCL 20 MG PO CAPS: 20.0000 mg | ORAL_CAPSULE | Freq: Every day | ORAL | 1 refills | Status: DC

## 2016-04-07 NOTE — Patient Instructions (Signed)
Follow up in 6 months to recheck BP We'll notify you of your lab results and make any changes if needed Continue to work on healthy diet and regular exercise- you can do it!! We'll call you with your audiology appt for the hearing evaluation Call with any questions or concerns Happy Holidays!!!

## 2016-04-07 NOTE — Progress Notes (Signed)
Pre visit review using our clinic review tool, if applicable. No additional management support is needed unless otherwise documented below in the visit note. 

## 2016-04-07 NOTE — Progress Notes (Signed)
   Subjective:    Patient ID: Kayla Combs, female    DOB: Jan 11, 1971, 45 y.o.   MRN: [id number]  HPI CPE- UTD on pap ([facility]), due for mammo.  UTD on Tdap.  Declines flu.     Review of Systems Patient reports no vision changes, adenopathy,fever, weight change,  persistant/recurrent hoarseness , swallowing issues, chest pain, palpitations, edema, persistant/recurrent cough, hemoptysis, dyspnea (rest/exertional/paroxysmal nocturnal), gastrointestinal bleeding (melena, rectal bleeding), abdominal pain, significant heartburn, bowel changes, GU symptoms (dysuria, hematuria, incontinence), Gyn symptoms (abnormal  bleeding, pain),  syncope, focal weakness, memory loss, numbness & tingling, skin/hair/nail changes, abnormal bruising or bleeding, anxiety, or depression.   + decreased hearing    Objective:   Physical Exam General Appearance:    Alert, cooperative, no distress, appears stated age  Head:    Normocephalic, without obvious abnormality, atraumatic  Eyes:    PERRL, conjunctiva/corneas clear, EOM's intact, fundi    benign, both eyes  Ears:    Normal TM's and external ear canals, both ears  Nose:   Nares normal, septum midline, mucosa normal, no drainage    or sinus tenderness  Throat:   Lips, mucosa, and tongue normal; teeth and gums normal  Neck:   Supple, symmetrical, trachea midline, no adenopathy;    Thyroid: no enlargement/tenderness/nodules  Back:     Symmetric, no curvature, ROM normal, no CVA tenderness  Lungs:     Clear to auscultation bilaterally, respirations unlabored  Chest Wall:    No tenderness or deformity   Heart:    Regular rate and rhythm, S1 and S2 normal, no murmur, rub   or gallop  Breast Exam:    Deferred to GYN  Abdomen:     Soft, non-tender, bowel sounds active all four quadrants,    no masses, no organomegaly  Genitalia:    Deferred to GYN  Rectal:    Extremities:   Extremities normal, atraumatic, no cyanosis or edema  Pulses:   2+ and symmetric  all extremities  Skin:   Skin color, texture, turgor normal, no rashes or lesions  Lymph nodes:   Cervical, supraclavicular, and axillary nodes normal  Neurologic:   CNII-XII intact, normal strength, sensation and reflexes    throughout          Assessment & Plan:

## 2016-04-07 NOTE — Assessment & Plan Note (Signed)
Pt's PE WNL w/ exception of obesity.  UTD on pap, due for mammo- pt plans to schedule.  Check labs.  Anticipatory guidance provided.  

## 2016-04-08 ENCOUNTER — Other Ambulatory Visit: Payer: Self-pay | Admitting: Family Medicine

## 2016-04-08 DIAGNOSIS — D72829 Elevated white blood cell count, unspecified: Secondary | ICD-10-CM

## 2016-04-08 LAB — CBC WITH DIFFERENTIAL/PLATELET
BASOS ABS: 0 10*3/uL (ref 0.0–0.1)
Basophils Relative: 0.2 % (ref 0.0–3.0)
EOS ABS: 0.4 10*3/uL (ref 0.0–0.7)
Eosinophils Relative: 2.3 % (ref 0.0–5.0)
HEMATOCRIT: 42.8 % (ref 36.0–46.0)
HEMOGLOBIN: 14.6 g/dL (ref 12.0–15.0)
LYMPHS PCT: 16.6 % (ref 12.0–46.0)
Lymphs Abs: 2.6 10*3/uL (ref 0.7–4.0)
MCHC: 34.1 g/dL (ref 30.0–36.0)
MCV: 86.2 fl (ref 78.0–100.0)
MONO ABS: 0.5 10*3/uL (ref 0.1–1.0)
Monocytes Relative: 3.5 % (ref 3.0–12.0)
NEUTROS ABS: 12.1 10*3/uL — AB (ref 1.4–7.7)
Neutrophils Relative %: 77.4 % — ABNORMAL HIGH (ref 43.0–77.0)
Platelets: 425 10*3/uL — ABNORMAL HIGH (ref 150.0–400.0)
RBC: 4.97 Mil/uL (ref 3.87–5.11)
RDW: 13.3 % (ref 11.5–15.5)
WBC: 15.7 10*3/uL — AB (ref 4.0–10.5)

## 2016-07-03 DIAGNOSIS — M5442 Lumbago with sciatica, left side: Secondary | ICD-10-CM | POA: Diagnosis not present

## 2016-07-03 DIAGNOSIS — S339XXA Sprain of unspecified parts of lumbar spine and pelvis, initial encounter: Secondary | ICD-10-CM | POA: Diagnosis not present

## 2016-07-07 DIAGNOSIS — S339XXA Sprain of unspecified parts of lumbar spine and pelvis, initial encounter: Secondary | ICD-10-CM | POA: Diagnosis not present

## 2016-07-07 DIAGNOSIS — M5442 Lumbago with sciatica, left side: Secondary | ICD-10-CM | POA: Diagnosis not present

## 2016-07-12 ENCOUNTER — Telehealth: Payer: Self-pay | Admitting: *Deleted

## 2016-07-12 NOTE — Telephone Encounter (Signed)
-----   Message from Sheliah HatchKatherine E Tabori, MD sent at 07/12/2016  8:08 AM EDT ----- Please have pt return for labs  Thanks! KT ----- Message ----- From: SYSTEM Sent: 07/12/2016  12:06 AM To: Sheliah HatchKatherine E Tabori, MD

## 2016-07-12 NOTE — Telephone Encounter (Signed)
Left message for patient to call to schedule a lab appointment, per Dr. Beverely Lowabori.

## 2016-09-26 ENCOUNTER — Encounter: Payer: Self-pay | Admitting: Family Medicine

## 2016-09-26 ENCOUNTER — Ambulatory Visit (INDEPENDENT_AMBULATORY_CARE_PROVIDER_SITE_OTHER): Payer: BLUE CROSS/BLUE SHIELD | Admitting: Family Medicine

## 2016-09-26 DIAGNOSIS — F418 Other specified anxiety disorders: Secondary | ICD-10-CM

## 2016-09-26 DIAGNOSIS — M5432 Sciatica, left side: Secondary | ICD-10-CM | POA: Diagnosis not present

## 2016-09-26 DIAGNOSIS — I10 Essential (primary) hypertension: Secondary | ICD-10-CM | POA: Diagnosis not present

## 2016-09-26 LAB — BASIC METABOLIC PANEL
BUN: 13 mg/dL (ref 6–23)
CALCIUM: 9.5 mg/dL (ref 8.4–10.5)
CO2: 29 mEq/L (ref 19–32)
Chloride: 102 mEq/L (ref 96–112)
Creatinine, Ser: 0.67 mg/dL (ref 0.40–1.20)
GFR: 100.05 mL/min (ref >60.00)
Glucose, Bld: 114 mg/dL — ABNORMAL HIGH (ref 70–99)
POTASSIUM: 5.2 meq/L — AB (ref 3.5–5.1)
SODIUM: 138 meq/L (ref 135–145)

## 2016-09-26 LAB — CBC WITH DIFFERENTIAL/PLATELET
BASOS ABS: 0.1 10*3/uL (ref 0.0–0.1)
Basophils Relative: 0.5 % (ref 0.0–3.0)
EOS PCT: 2.6 % (ref 0.0–5.0)
Eosinophils Absolute: 0.3 10*3/uL (ref 0.0–0.7)
HEMATOCRIT: 42 % (ref 36.0–46.0)
HEMOGLOBIN: 14.2 g/dL (ref 12.0–15.0)
LYMPHS PCT: 17.6 % (ref 12.0–46.0)
Lymphs Abs: 2.3 10*3/uL (ref 0.7–4.0)
MCHC: 33.8 g/dL (ref 30.0–36.0)
MCV: 86.7 fl (ref 78.0–100.0)
MONOS PCT: 7.1 % (ref 3.0–12.0)
Monocytes Absolute: 0.9 10*3/uL (ref 0.1–1.0)
Neutro Abs: 9.6 10*3/uL — ABNORMAL HIGH (ref 1.4–7.7)
Neutrophils Relative %: 72.2 % (ref 43.0–77.0)
Platelets: 401 10*3/uL — ABNORMAL HIGH (ref 150.0–400.0)
RBC: 4.85 Mil/uL (ref 3.87–5.11)
RDW: 13.5 % (ref 11.5–15.5)
WBC: 13.2 10*3/uL — AB (ref 4.0–10.5)

## 2016-09-26 MED ORDER — ALPRAZOLAM 0.25 MG PO TABS: 0.2500 mg | ORAL_TABLET | Freq: Two times a day (BID) | ORAL | 1 refills | Status: DC | PRN

## 2016-09-26 MED ORDER — METOPROLOL TARTRATE 25 MG PO TABS: ORAL_TABLET | ORAL | 1 refills | Status: DC

## 2016-09-26 MED ORDER — ZOLPIDEM TARTRATE 10 MG PO TABS: 10.0000 mg | ORAL_TABLET | Freq: Every evening | ORAL | 1 refills | Status: DC | PRN

## 2016-09-26 MED ORDER — FLUOXETINE HCL 20 MG PO CAPS: 20.0000 mg | ORAL_CAPSULE | Freq: Every day | ORAL | 1 refills | Status: DC

## 2016-09-26 NOTE — Assessment & Plan Note (Signed)
Ongoing.  Stressed need for healthy diet and regular exercise.  Will follow.

## 2016-09-26 NOTE — Assessment & Plan Note (Signed)
Ongoing issue.  Adequate control.  Asymptomatic at this time.  Refills provided.

## 2016-09-26 NOTE — Patient Instructions (Signed)
Schedule your complete physical in 6 months We'll notify you of your lab results and make any changes if needed Try and make healthy food choices and get regular exercise- you can do it!!! We'll call you with your Sports Med appt for the sciatica Call with any questions or concerns Have a great summer!!

## 2016-09-26 NOTE — Assessment & Plan Note (Signed)
New.  [facility]  Asking for referral.  Referral to Sports Med placed.  Will follow.

## 2016-09-26 NOTE — Progress Notes (Signed)
Pre visit review using our clinic review tool, if applicable. No additional management support is needed unless otherwise documented below in the visit note. 

## 2016-09-26 NOTE — Assessment & Plan Note (Signed)
Chronic problem.  Adequate control today.  Asymptomatic.  Check labs.  No anticipated med changes.  Will follow. 

## 2016-09-26 NOTE — Progress Notes (Signed)
   Subjective:    Patient ID: Kayla Combs, female    DOB: 1971/03/27, 46 y.o.   MRN: [id number]  HPI HTN- adequate control on Metoprolol.  Denies CP, palpitations, SOB, HAs, visual changes.    Obesity- chronic problem.  Pt has gained 3 lbs since last visit.  Pt has not been exercising.  Not following a particular diet.  Anxiety/depression- ongoing issue.  On Fluoxetine 20mg  daily.  Ambien prn sleep.  Pt reports sxs are well controlled.  Sciatica- L sided.  Pt had to go to [facility] for 'trigger point' injxn.  This improved pain but sxs are now returning.  Pt is asking for referral.   Review of Systems For ROS see HPI     Objective:   Physical Exam  Constitutional: She is oriented to person, place, and time. She appears well-developed and well-nourished. No distress.  obese  HENT:  Head: Normocephalic and atraumatic.  Eyes: Conjunctivae and EOM are normal. Pupils are equal, round, and reactive to light.  Neck: Normal range of motion. Neck supple. No thyromegaly present.  Cardiovascular: Normal rate, regular rhythm, normal heart sounds and intact distal pulses.   No murmur heard. Pulmonary/Chest: Effort normal and breath sounds normal. No respiratory distress.  Abdominal: Soft. She exhibits no distension. There is no tenderness.  Musculoskeletal: She exhibits no edema.  Lymphadenopathy:    She has no cervical adenopathy.  Neurological: She is alert and oriented to person, place, and time.  + SLR bilaterally  Skin: Skin is warm and dry.  Psychiatric: She has a normal mood and affect. Her behavior is normal.  Vitals reviewed.         Assessment & Plan:

## 2016-09-27 ENCOUNTER — Other Ambulatory Visit (INDEPENDENT_AMBULATORY_CARE_PROVIDER_SITE_OTHER): Payer: BLUE CROSS/BLUE SHIELD

## 2016-09-27 DIAGNOSIS — R739 Hyperglycemia, unspecified: Secondary | ICD-10-CM | POA: Diagnosis not present

## 2016-09-27 LAB — HEMOGLOBIN A1C: Hgb A1c MFr Bld: 5.2 % (ref 4.6–6.5)

## 2016-09-30 ENCOUNTER — Encounter: Payer: Self-pay | Admitting: Sports Medicine

## 2016-09-30 ENCOUNTER — Ambulatory Visit (INDEPENDENT_AMBULATORY_CARE_PROVIDER_SITE_OTHER): Payer: BLUE CROSS/BLUE SHIELD | Admitting: Sports Medicine

## 2016-09-30 DIAGNOSIS — S76319A Strain of muscle, fascia and tendon of the posterior muscle group at thigh level, unspecified thigh, initial encounter: Secondary | ICD-10-CM | POA: Insufficient documentation

## 2016-09-30 DIAGNOSIS — S76312A Strain of muscle, fascia and tendon of the posterior muscle group at thigh level, left thigh, initial encounter: Secondary | ICD-10-CM | POA: Diagnosis not present

## 2016-09-30 MED ORDER — CYCLOBENZAPRINE HCL 10 MG PO TABS: 10.0000 mg | ORAL_TABLET | Freq: Every day | ORAL | 0 refills | Status: DC

## 2016-09-30 NOTE — Progress Notes (Signed)
OFFICE VISIT NOTE Veverly FellsMichael D. Delorise Shinerigby, DO  [facility] [place] [phone]  Kayla Combs - 46 y.o. female MRN [id number]  Date of birth: [birth year]  Visit Date: 09/30/2016  PCP: Sheliah Hatchabori, Katherine E, MD   Referred by: Sheliah Hatchabori, Katherine E, MD  Felix Ahmadiallie Fransen, R.T.(R) acting as scribe for Dr. Berline Choughigby.  SUBJECTIVE:   Chief Complaint  Patient presents with  . Sciatica of left side   HPI: As below and per problem based documentation when appropriate.  Pt presents today with sciatica of left side. Pt had back pain about 3-4 months ago. Pt had trigger injection with original back pain. Pain radiates to back of knee.  Sciatica has been going on for a few weeks.  Pt denies injury or trauma   The pain is described as burning, shooting, and stabbing pain and is rated as 1/10 at rest and 7/10 at its worst.  Worsened with sneezing, coughing, and bending Nothing seems to improve pain.  Therapies tried include Advil with very minimal relief      Review of Systems  Constitutional: Negative for chills and fever.       Night Sweats  Respiratory: Negative for shortness of breath.   Cardiovascular: Positive for leg swelling. Negative for chest pain.       Swelling in right foot  Gastrointestinal: Negative for nausea and vomiting.  Musculoskeletal: Positive for back pain.  Neurological: Negative for dizziness, tingling and headaches.  Endo/Heme/Allergies: Does not bruise/bleed easily.    Otherwise per HPI.  HISTORY & PERTINENT PRIOR DATA:  No specialty comments available. She reports that she quit smoking about 5 years ago. Her smoking use included Cigarettes. She has a 3.00 pack-year smoking history. She has never used smokeless tobacco.   Recent Labs  09/27/16 0956  HGBA1C 5.2   Medications & Allergies reviewed per EMR Patient Active Problem List   Diagnosis Date Noted  . Hamstring strain, initial encounter 09/30/2016  . Thyroid  nodule 01/21/2015  . Hypocalcemia 01/21/2015  . Leukocytosis 01/08/2015  . Needs sleep apnea assessment 01/08/2015  . Right shoulder pain 08/06/2014  . Pap smear for cervical cancer screening 04/03/2014  . Cervical polyp 04/03/2014  . Severe obesity (BMI >= 40) (HCC) 04/02/2013  . Insomnia 11/27/2012  . Strain of tibialis anterior muscle 06/28/2012  . Tachycardia 04/30/2012  . Routine general medical examination at a health care facility 03/06/2012  . Hypertriglyceridemia 03/06/2012  . HTN (hypertension) 03/06/2012  . Depression with anxiety 03/06/2012  . Cough 05/31/2011  . URI 12/17/2008  . Sciatica of left side 12/17/2008   Past Medical History:  Diagnosis Date  . Asthma   . Cough   . Depression   . Hyperlipidemia   . Hypertension   . Migraine headache   . Sinusitis    Family History  Problem Relation Age of Onset  . Cervical cancer Mother    Past Surgical History:  Procedure Laterality Date  . CESAREAN SECTION     x 3   . TONSILLECTOMY AND ADENOIDECTOMY     Social History   Occupational History  . RN    Social History Main Topics  . Smoking status: Former Smoker    Packs/day: 0.30    Years: 10.00    Types: Cigarettes    Quit date: 03/26/2011  . Smokeless tobacco: Never Used  . Alcohol use 0.0 - 2.0 oz/week  . Drug use: No  . Sexual activity: Not on file  OBJECTIVE:  VS:  HT:5\' 3"  (160 cm)   WT:259 lb 9.6 oz (117.8 kg)  BMI:46.1    BP:118/88  HR:89bpm  TEMP: ( )  RESP:96 % EXAM: Findings:  WDWN, NAD, Non-toxic appearing Alert & appropriately interactive Not depressed or anxious appearing No increased work of breathing. Pupils are equal. EOM intact without nystagmus No clubbing or cyanosis of the extremities appreciated No significant rashes/lesions/ulcerations overlying the examined area. DP & PT pulses 2+/4.  No significant pretibial edema. Sensation intact to light touch in lower extremities.  Back and leg: Pain with straight leg  raise on the left, not on the right localizing to the posterior buttock.  No back pain.  She does have pain with palpation within the popliteal space that causes radiation to the left buttock.  No focal TTP over the greater sciatic notch, greater trochanters or piriformis musculature.  Mild TTP over the origin of the hamstring muscles over the ischial spines.  Most focal pain with manual muscle testing is with her knee bent at 90.  She also has pain while repositioning on the exam table.  Otherwise lower extremity strength is 5 out of 5 in all myotomes.  Lower extremity sensation is intact to light touch although she does have a slight difference in the L5 distribution to light touch but only minimally symptomatic.  Lower extremity DTRs are 2+/4 diffusely.     No results found. ASSESSMENT & PLAN:   Problem List Items Addressed This Visit    Hamstring strain, initial encounter    Symptoms are more consistent with hamstring strain then true sciatica.  No symptoms going down past her knee and focally tender at the origin of the hamstring.  No known injury.  Suspect overuse with chronic likely tendinopathy.  We will have her begin on basic hamstring strengthening and stretching exercises and try a muscle relaxer for at nighttime.  If any lack of improvement she will plan to get referred to physical therapy but she would like to try home exercise program initially.  We can also consider dry needling to the hamstring if persistent symptoms.  +++++++++++++++++++++++++++++++++++++++++++++++++++++++++++++++ PROCEDURE NOTE: THERAPEUTIC EXERCISES (97110) 15 minutes spent for Therapeutic exercises as stated in above notes.  This included exercises focusing on stretching, strengthening, with significant focus on eccentric aspects.   Proper technique shown and discussed handout in great detail with ATC.  All questions were discussed and answered.         Other Visit Diagnoses    Left hamstring muscle strain,  initial encounter          Follow-up: Return in about 6 weeks (around 11/11/2016).   CMA/ATC served as Neurosurgeon during this visit. History, Physical, and Plan performed by medical provider. Documentation and orders reviewed and attested to.      Gaspar Bidding, DO    [facility]

## 2016-09-30 NOTE — Patient Instructions (Signed)
Please perform the exercise program that Fayrene FearingJames has prepared for you and gone over in detail on a daily basis.  In addition to the handout you were provided you can access your program through: [email or web address]   Your unique program code is: [email or web address]

## 2016-09-30 NOTE — Assessment & Plan Note (Signed)
Symptoms are more consistent with hamstring strain then true sciatica.  No symptoms going down past her knee and focally tender at the origin of the hamstring.  No known injury.  Suspect overuse with chronic likely tendinopathy.  We will have her begin on basic hamstring strengthening and stretching exercises and try a muscle relaxer for at nighttime.  If any lack of improvement she will plan to get referred to physical therapy but she would like to try home exercise program initially.  We can also consider dry needling to the hamstring if persistent symptoms.  +++++++++++++++++++++++++++++++++++++++++++++++++++++++++++++++ PROCEDURE NOTE: THERAPEUTIC EXERCISES (97110) 15 minutes spent for Therapeutic exercises as stated in above notes.  This included exercises focusing on stretching, strengthening, with significant focus on eccentric aspects.   Proper technique shown and discussed handout in great detail with ATC.  All questions were discussed and answered.

## 2016-11-07 ENCOUNTER — Ambulatory Visit: Payer: BLUE CROSS/BLUE SHIELD | Admitting: Sports Medicine

## 2016-12-30 DIAGNOSIS — Z1231 Encounter for screening mammogram for malignant neoplasm of breast: Secondary | ICD-10-CM | POA: Diagnosis not present

## 2016-12-30 LAB — HM MAMMOGRAPHY

## 2017-01-10 ENCOUNTER — Other Ambulatory Visit: Payer: Self-pay | Admitting: General Practice

## 2017-01-10 MED ORDER — FLUOXETINE HCL 20 MG PO CAPS: 20.0000 mg | ORAL_CAPSULE | Freq: Every day | ORAL | 1 refills | Status: DC

## 2017-01-10 MED ORDER — METOPROLOL TARTRATE 25 MG PO TABS: ORAL_TABLET | ORAL | 1 refills | Status: DC

## 2017-04-10 ENCOUNTER — Other Ambulatory Visit (HOSPITAL_COMMUNITY)
Admission: RE | Admit: 2017-04-10 | Discharge: 2017-04-10 | Disposition: A | Discharge status: Home / Self Care | Payer: BLUE CROSS/BLUE SHIELD | Source: Ambulatory Visit | Attending: Family Medicine | Admitting: Family Medicine

## 2017-04-10 ENCOUNTER — Other Ambulatory Visit: Payer: Self-pay

## 2017-04-10 ENCOUNTER — Encounter: Payer: Self-pay | Admitting: Family Medicine

## 2017-04-10 ENCOUNTER — Ambulatory Visit (INDEPENDENT_AMBULATORY_CARE_PROVIDER_SITE_OTHER): Payer: BLUE CROSS/BLUE SHIELD | Admitting: Family Medicine

## 2017-04-10 VITALS — BP 126/86 | HR 81 | Temp 98.1°F | Resp 16 | Ht 63.0 in | Wt 251.0 lb

## 2017-04-10 DIAGNOSIS — Z124 Encounter for screening for malignant neoplasm of cervix: Secondary | ICD-10-CM

## 2017-04-10 DIAGNOSIS — Z23 Encounter for immunization: Secondary | ICD-10-CM | POA: Diagnosis not present

## 2017-04-10 DIAGNOSIS — Z0000 Encounter for general adult medical examination without abnormal findings: Secondary | ICD-10-CM | POA: Diagnosis not present

## 2017-04-10 DIAGNOSIS — I10 Essential (primary) hypertension: Secondary | ICD-10-CM

## 2017-04-10 DIAGNOSIS — E041 Nontoxic single thyroid nodule: Secondary | ICD-10-CM

## 2017-04-10 LAB — CBC WITH DIFFERENTIAL/PLATELET
BASOS ABS: 0.1 10*3/uL (ref 0.0–0.1)
BASOS PCT: 0.6 % (ref 0.0–3.0)
EOS PCT: 3.4 % (ref 0.0–5.0)
Eosinophils Absolute: 0.4 10*3/uL (ref 0.0–0.7)
HEMATOCRIT: 42.7 % (ref 36.0–46.0)
Hemoglobin: 14.2 g/dL (ref 12.0–15.0)
LYMPHS PCT: 18.9 % (ref 12.0–46.0)
Lymphs Abs: 2.4 10*3/uL (ref 0.7–4.0)
MCHC: 33.3 g/dL (ref 30.0–36.0)
MCV: 86.1 fl (ref 78.0–100.0)
MONOS PCT: 6.5 % (ref 3.0–12.0)
Monocytes Absolute: 0.8 10*3/uL (ref 0.1–1.0)
NEUTROS ABS: 9 10*3/uL — AB (ref 1.4–7.7)
Neutrophils Relative %: 70.6 % (ref 43.0–77.0)
PLATELETS: 398 10*3/uL (ref 150.0–400.0)
RBC: 4.96 Mil/uL (ref 3.87–5.11)
RDW: 13.1 % (ref 11.5–15.5)
WBC: 12.8 10*3/uL — ABNORMAL HIGH (ref 4.0–10.5)

## 2017-04-10 LAB — TSH: TSH: 2.67 u[IU]/mL (ref 0.35–4.50)

## 2017-04-10 LAB — LIPID PANEL
CHOL/HDL RATIO: 5
CHOLESTEROL: 182 mg/dL (ref 0–200)
HDL: 37 mg/dL — ABNORMAL LOW (ref >39.00)
NonHDL: 145.45
TRIGLYCERIDES: 303 mg/dL — AB (ref 0.0–149.0)
VLDL: 60.6 mg/dL — ABNORMAL HIGH (ref 0.0–40.0)

## 2017-04-10 LAB — BASIC METABOLIC PANEL
BUN: 17 mg/dL (ref 6–23)
CALCIUM: 9.3 mg/dL (ref 8.4–10.5)
CHLORIDE: 100 meq/L (ref 96–112)
CO2: 30 meq/L (ref 19–32)
CREATININE: 0.73 mg/dL (ref 0.40–1.20)
GFR: 90.42 mL/min (ref >60.00)
GLUCOSE: 103 mg/dL — AB (ref 70–99)
Potassium: 4.6 mEq/L (ref 3.5–5.1)
Sodium: 136 mEq/L (ref 135–145)

## 2017-04-10 LAB — HEPATIC FUNCTION PANEL
ALT: 22 U/L (ref 0–35)
AST: 16 U/L (ref 0–37)
Albumin: 4.4 g/dL (ref 3.5–5.2)
Alkaline Phosphatase: 71 U/L (ref 39–117)
BILIRUBIN TOTAL: 0.7 mg/dL (ref 0.2–1.2)
Bilirubin, Direct: 0.1 mg/dL (ref 0.0–0.3)
Total Protein: 7.1 g/dL (ref 6.0–8.3)

## 2017-04-10 LAB — LDL CHOLESTEROL, DIRECT: LDL DIRECT: 119 mg/dL

## 2017-04-10 LAB — HEMOGLOBIN A1C: Hgb A1c MFr Bld: 5.4 % (ref 4.6–6.5)

## 2017-04-10 MED ORDER — FLUOXETINE HCL 20 MG PO CAPS: 20.0000 mg | ORAL_CAPSULE | Freq: Every day | ORAL | 1 refills | Status: DC

## 2017-04-10 MED ORDER — METOPROLOL TARTRATE 25 MG PO TABS: ORAL_TABLET | ORAL | 1 refills | Status: DC

## 2017-04-10 MED ORDER — METOPROLOL TARTRATE 25 MG PO TABS
ORAL_TABLET | ORAL | 1 refills | Status: DC
Start: 2017-04-10 — End: 2017-04-10

## 2017-04-10 NOTE — Assessment & Plan Note (Signed)
Chronic problem.  Adequate control today.  Asymptomatic.  Check labs.  Will continue to follow.

## 2017-04-10 NOTE — Progress Notes (Signed)
   Subjective:    Patient ID: Kayla Combs, female    DOB: 08-Sep-1970, 46 y.o.   MRN: [id number]  HPI CPE- UTD on mammo, due for pap.  UTD on Tdap, due for flu.  No concerns today.   Review of Systems Patient reports no vision/ hearing changes, adenopathy,fever, weight change,  persistant/recurrent hoarseness , swallowing issues, chest pain, palpitations, edema, persistant/recurrent cough, hemoptysis, dyspnea (rest/exertional/paroxysmal nocturnal), gastrointestinal bleeding (melena, rectal bleeding), abdominal pain, significant heartburn, bowel changes, GU symptoms (dysuria, hematuria, incontinence), Gyn symptoms (abnormal  bleeding, pain),  syncope, focal weakness, memory loss, numbness & tingling, skin/hair/nail changes, abnormal bruising or bleeding, anxiety, or depression.     Objective:   Physical Exam  General Appearance:    Alert, cooperative, no distress, appears stated age, obese  Head:    Normocephalic, without obvious abnormality, atraumatic  Eyes:    PERRL, conjunctiva/corneas clear, EOM's intact, fundi    benign, both eyes  Ears:    Normal TM's and external ear canals, both ears  Nose:   Nares normal, septum midline, mucosa normal, no drainage    or sinus tenderness  Throat:   Lips, mucosa, and tongue normal; teeth and gums normal  Neck:   Supple, symmetrical, trachea midline, no adenopathy;    Thyroid: no enlargement/tenderness/nodules  Back:     Symmetric, no curvature, ROM normal, no CVA tenderness  Lungs:     Clear to auscultation bilaterally, respirations unlabored  Chest Wall:    No tenderness or deformity   Heart:    Regular rate and rhythm, S1 and S2 normal, no murmur, rub   or gallop  Breast Exam:    No tenderness, masses, or nipple abnormality  Abdomen:     Soft, non-tender, bowel sounds active all four quadrants,    no masses, no organomegaly  Genitalia:    External genitalia normal, cervix w/ small polyp present at os, no CMT, uterus in normal size  and position, adnexa w/out mass or tenderness, mucosa pink and moist, no lesions or discharge present  Rectal:    Normal external appearance  Extremities:   Extremities normal, atraumatic, no cyanosis or edema  Pulses:   2+ and symmetric all extremities  Skin:   Skin color, texture, turgor normal, no rashes or lesions  Lymph nodes:   Cervical, supraclavicular, and axillary nodes normal  Neurologic:   CNII-XII intact, normal strength, sensation and reflexes    throughout          Assessment & Plan:

## 2017-04-10 NOTE — Assessment & Plan Note (Signed)
Ongoing issue for pt.  Stressed need for healthy diet and regular exercise.  Check labs to risk stratify.  Will follow 

## 2017-04-10 NOTE — Assessment & Plan Note (Signed)
Pt had US in 2016 and we will repeat to assess for interval growth.

## 2017-04-10 NOTE — Patient Instructions (Signed)
Follow up in 6 months to recheck BP We'll notify you of your lab results and make any changes if needed Continue to work on healthy diet and regular exercise- you can do it! We'll call you with your thyroid ultrasound appt Call with any questions or concerns Happy Holidays!!!

## 2017-04-10 NOTE — Assessment & Plan Note (Signed)
Pt's PE WNL w/ exception of known obesity and cervical polyp.  UTD on mammo.  Pap done today.  UTD on Tdap, flu shot given today.  Check labs.  Anticipatory guidance provided.

## 2017-04-10 NOTE — Assessment & Plan Note (Signed)
Pap collected. 

## 2017-04-11 ENCOUNTER — Encounter: Payer: Self-pay | Admitting: Family Medicine

## 2017-04-11 LAB — CYTOLOGY - PAP
DIAGNOSIS: NEGATIVE
HPV: NOT DETECTED

## 2017-04-12 ENCOUNTER — Ambulatory Visit (HOSPITAL_BASED_OUTPATIENT_CLINIC_OR_DEPARTMENT_OTHER)
Admission: RE | Admit: 2017-04-12 | Discharge: 2017-04-12 | Disposition: A | Discharge status: Home / Self Care | Payer: BLUE CROSS/BLUE SHIELD | Source: Ambulatory Visit | Attending: Family Medicine | Admitting: Family Medicine

## 2017-04-12 ENCOUNTER — Encounter: Payer: Self-pay | Admitting: General Practice

## 2017-04-12 DIAGNOSIS — E041 Nontoxic single thyroid nodule: Secondary | ICD-10-CM

## 2017-04-12 DIAGNOSIS — E042 Nontoxic multinodular goiter: Secondary | ICD-10-CM | POA: Diagnosis not present

## 2017-05-02 IMAGING — US US SOFT TISSUE HEAD/NECK
1 series · 14 of 25 positions shown · non-contrast
Comparison: CT 01/07/2015

CLINICAL DATA: Thyromegaly, right nodule noted on CT chest

EXAM:
THYROID ULTRASOUND
TECHNIQUE: Ultrasound examination of the thyroid gland and adjacent soft
tissues was performed.

[Series 1: us soft tissue head/neck · 0.07mm/px · 14 of 26 slices shown]
[im 1/26]
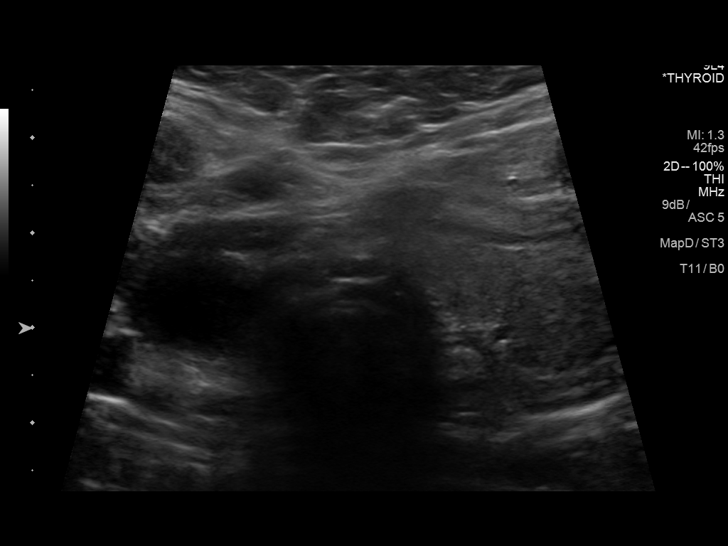
[im 3/26]
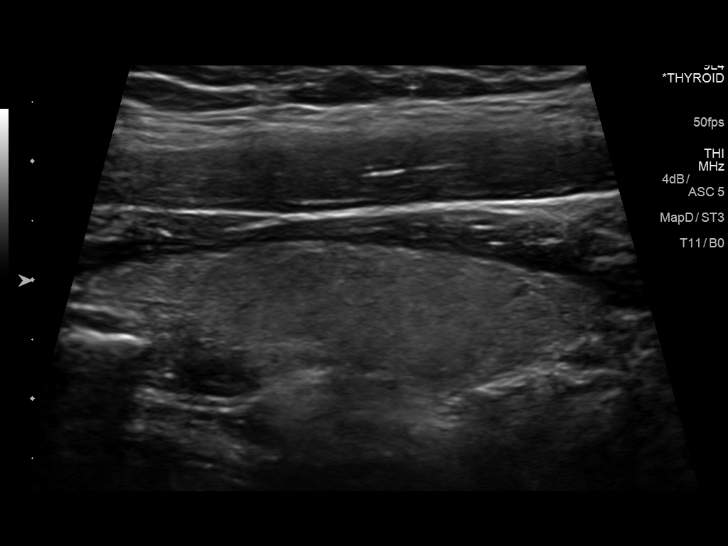
[im 5/26]
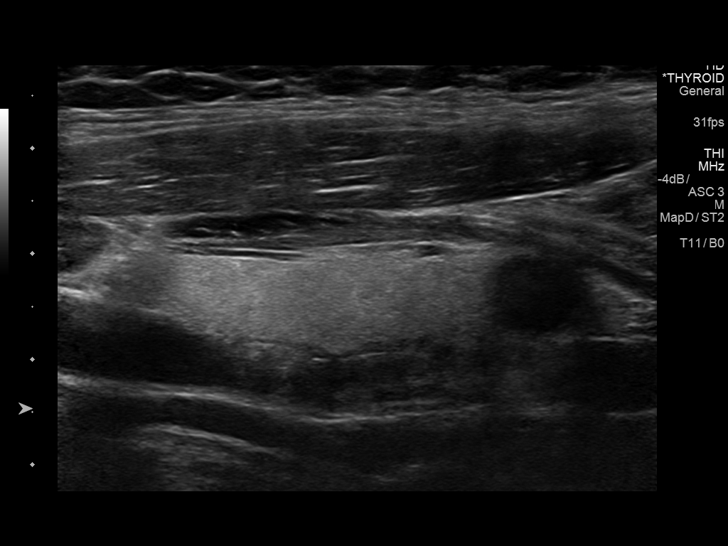
[im 7/26]
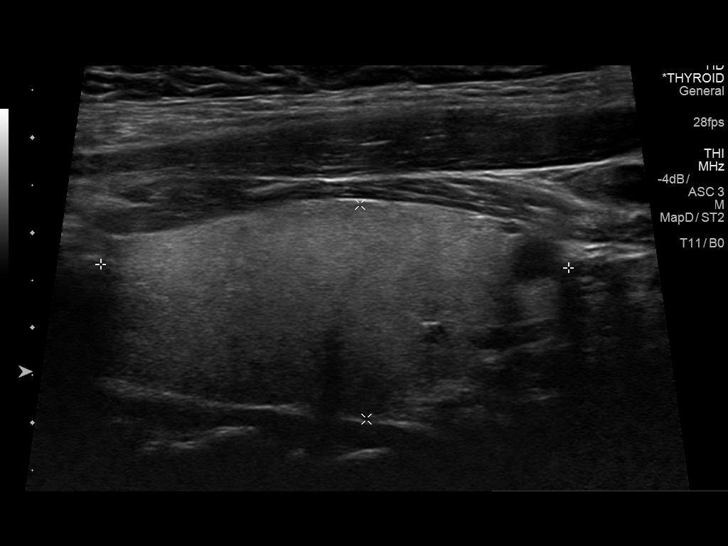
[im 9/26]
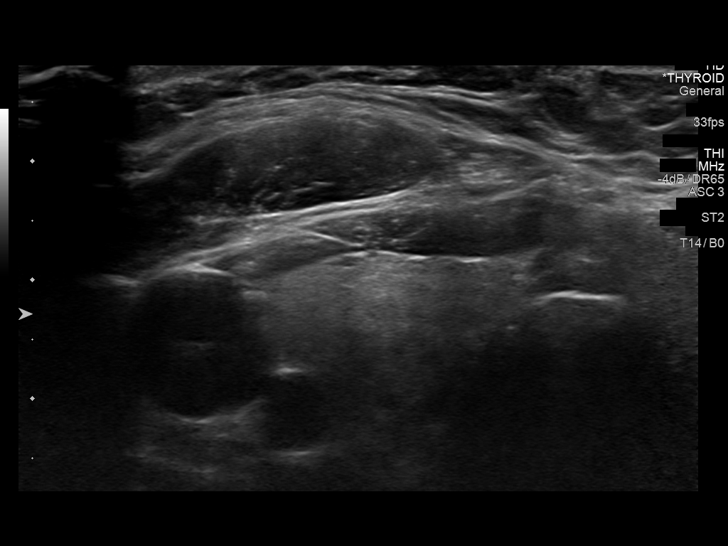
[im 10/26]
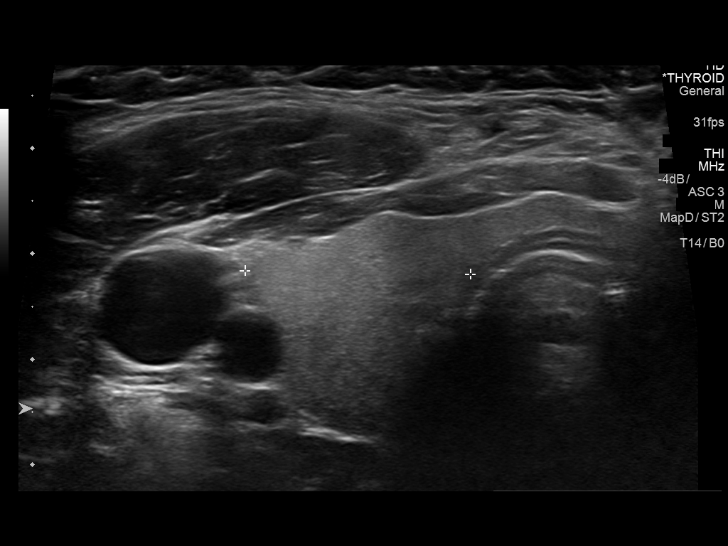
[im 12/26]
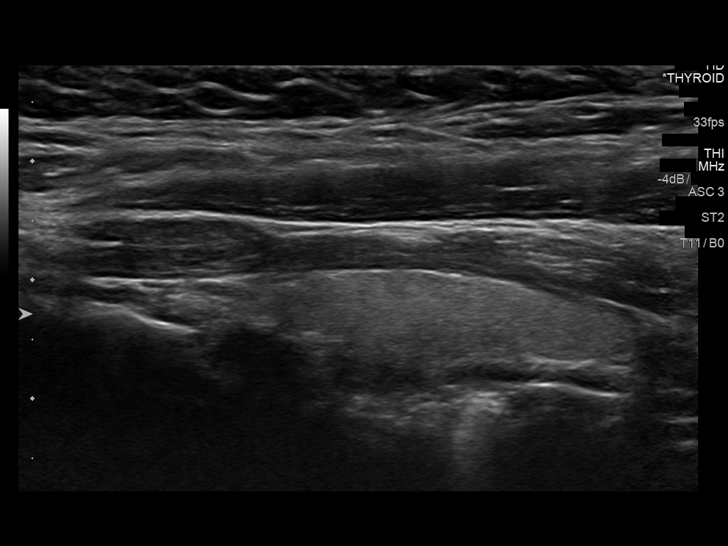
[im 14/26]
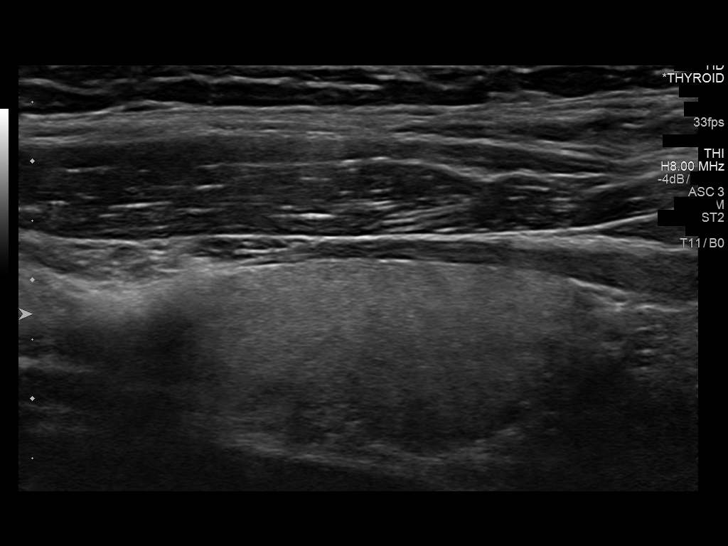
[im 16/26]
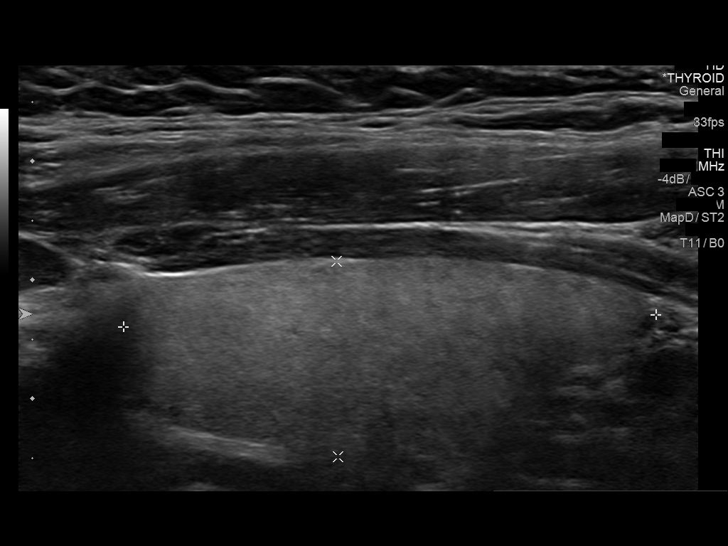
[im 17/26]
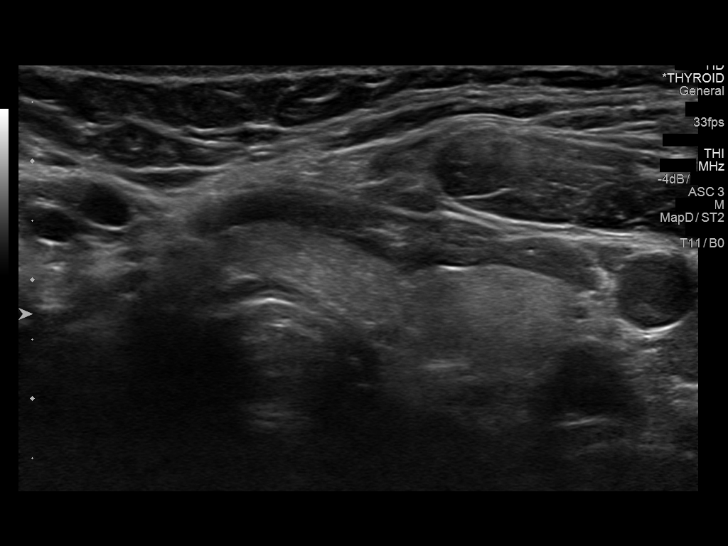
[im 19/26]
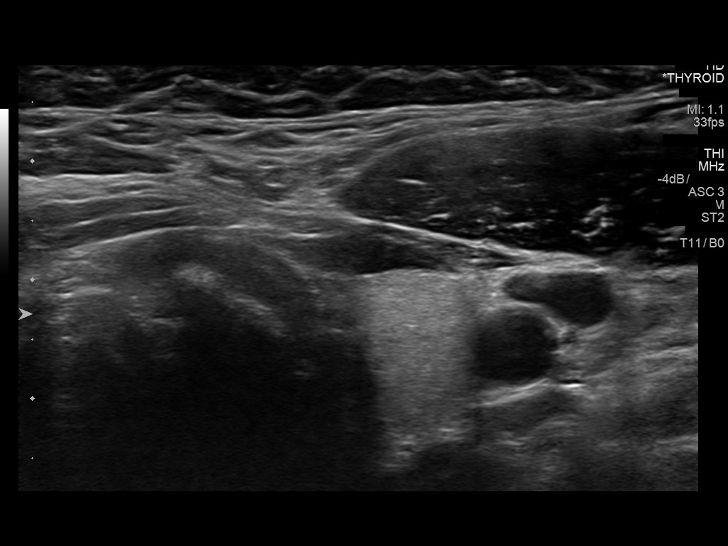
[im 21/26]
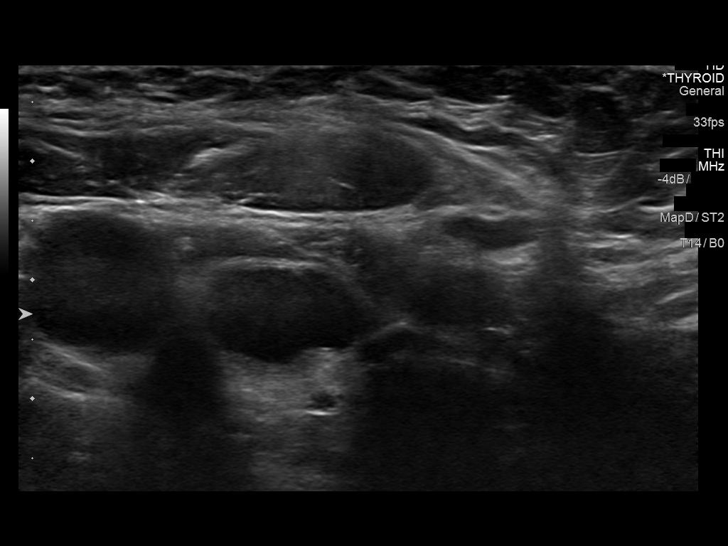
[im 23/26]
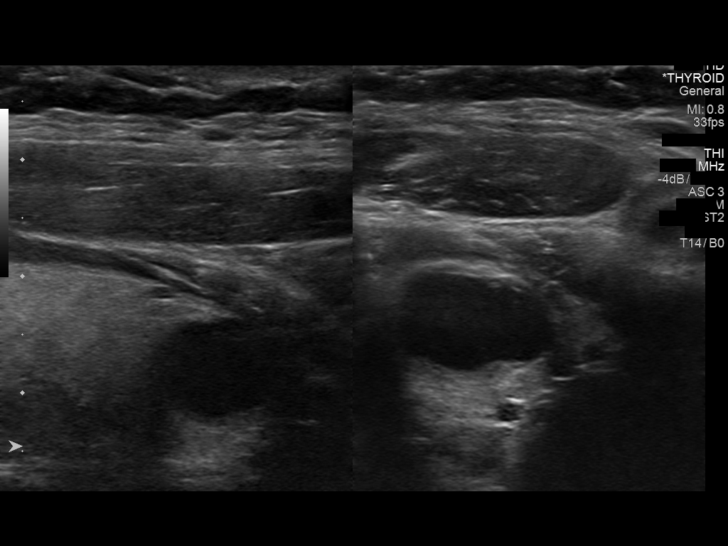
[im 26/26]
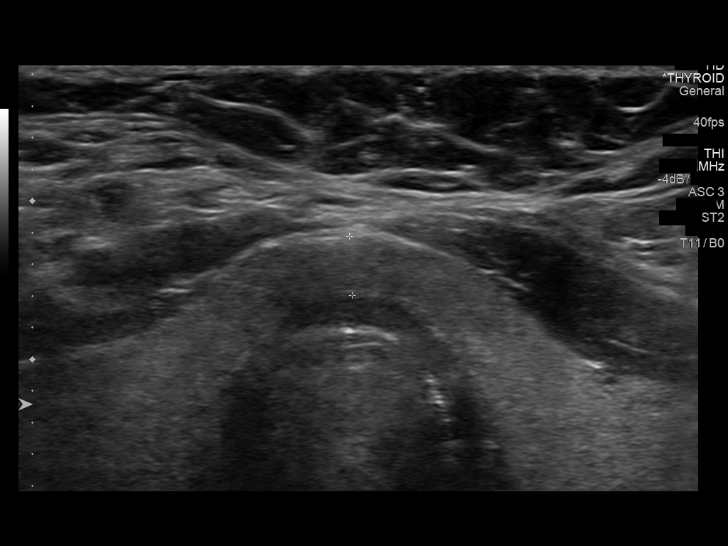

[14 of 25 positions shown; findings below may reference images not displayed]

FINDINGS: Right thyroid lobe

Measurements: 49 x 23 x 21 mm. Solitary 10 x 13 x 9 mm complex
mostly cystic nodule, inferior pole

Left thyroid lobe

Measurements: 45 x 17 x 19 mm. Homogeneous echotexture without focal
lesion.

Isthmus

Thickness: 4 mm.  No nodules visualized.

Lymphadenopathy

None visualized.
IMPRESSION: 1. Solitary 13 mm complex cyst, inferior pole right lobe. Findings
do not meet current consensus criteria for biopsy. Follow-up by
clinical exam is recommended. If patient has known risk factors for
thyroid carcinoma, consider follow-up ultrasound in 12 months. If
patient is clinically hyperthyroid, consider nuclear medicine
thyroid uptake and scan. This recommendation follows the consensus
statement: Management of Thyroid Nodules Detected as US: Society of
Radiologists in Ultrasound Consensus Conference Statement. Radiology

## 2017-10-09 ENCOUNTER — Ambulatory Visit: Payer: BLUE CROSS/BLUE SHIELD | Admitting: Family Medicine

## 2017-11-06 ENCOUNTER — Ambulatory Visit: Payer: BLUE CROSS/BLUE SHIELD | Admitting: Family Medicine

## 2017-12-13 ENCOUNTER — Ambulatory Visit: Payer: BLUE CROSS/BLUE SHIELD | Admitting: Family Medicine

## 2017-12-23 ENCOUNTER — Encounter: Payer: Self-pay | Admitting: Family Medicine

## 2017-12-26 MED ORDER — FLUOXETINE HCL 20 MG PO CAPS: 20.0000 mg | ORAL_CAPSULE | Freq: Every day | ORAL | 0 refills | Status: DC

## 2017-12-26 MED ORDER — METOPROLOL TARTRATE 25 MG PO TABS: ORAL_TABLET | ORAL | 0 refills | Status: DC

## 2017-12-28 ENCOUNTER — Ambulatory Visit: Payer: BLUE CROSS/BLUE SHIELD | Admitting: Family Medicine

## 2018-02-14 ENCOUNTER — Ambulatory Visit: Payer: BLUE CROSS/BLUE SHIELD

## 2018-02-15 ENCOUNTER — Ambulatory Visit (INDEPENDENT_AMBULATORY_CARE_PROVIDER_SITE_OTHER): Payer: BLUE CROSS/BLUE SHIELD

## 2018-02-15 DIAGNOSIS — Z23 Encounter for immunization: Secondary | ICD-10-CM

## 2018-04-19 ENCOUNTER — Other Ambulatory Visit: Payer: Self-pay

## 2018-04-19 ENCOUNTER — Encounter: Payer: Self-pay | Admitting: Family Medicine

## 2018-04-19 ENCOUNTER — Ambulatory Visit (INDEPENDENT_AMBULATORY_CARE_PROVIDER_SITE_OTHER): Payer: BLUE CROSS/BLUE SHIELD | Admitting: Family Medicine

## 2018-04-19 VITALS — BP 124/83 | HR 93 | Temp 99.4°F | Resp 17 | Ht 63.0 in | Wt 260.5 lb

## 2018-04-19 DIAGNOSIS — E559 Vitamin D deficiency, unspecified: Secondary | ICD-10-CM

## 2018-04-19 DIAGNOSIS — J329 Chronic sinusitis, unspecified: Secondary | ICD-10-CM

## 2018-04-19 DIAGNOSIS — B9689 Other specified bacterial agents as the cause of diseases classified elsewhere: Secondary | ICD-10-CM | POA: Diagnosis not present

## 2018-04-19 DIAGNOSIS — I10 Essential (primary) hypertension: Secondary | ICD-10-CM | POA: Diagnosis not present

## 2018-04-19 DIAGNOSIS — Z0000 Encounter for general adult medical examination without abnormal findings: Secondary | ICD-10-CM

## 2018-04-19 LAB — CBC WITH DIFFERENTIAL/PLATELET
Basophils Absolute: 0.1 10*3/uL (ref 0.0–0.1)
Basophils Relative: 0.6 % (ref 0.0–3.0)
Eosinophils Absolute: 0.3 10*3/uL (ref 0.0–0.7)
Eosinophils Relative: 2.3 % (ref 0.0–5.0)
HCT: 42.1 % (ref 36.0–46.0)
Hemoglobin: 14.4 g/dL (ref 12.0–15.0)
Lymphocytes Relative: 10.3 % — ABNORMAL LOW (ref 12.0–46.0)
Lymphs Abs: 1.2 10*3/uL (ref 0.7–4.0)
MCHC: 34.2 g/dL (ref 30.0–36.0)
MCV: 84.8 fl (ref 78.0–100.0)
Monocytes Absolute: 0.9 10*3/uL (ref 0.1–1.0)
Monocytes Relative: 7.4 % (ref 3.0–12.0)
Neutro Abs: 9.2 10*3/uL — ABNORMAL HIGH (ref 1.4–7.7)
Neutrophils Relative %: 79.4 % — ABNORMAL HIGH (ref 43.0–77.0)
Platelets: 371 10*3/uL (ref 150.0–400.0)
RBC: 4.97 Mil/uL (ref 3.87–5.11)
RDW: 13 % (ref 11.5–15.5)
WBC: 11.6 10*3/uL — ABNORMAL HIGH (ref 4.0–10.5)

## 2018-04-19 LAB — BASIC METABOLIC PANEL
BUN: 14 mg/dL (ref 6–23)
CO2: 29 mEq/L (ref 19–32)
Calcium: 9.7 mg/dL (ref 8.4–10.5)
Chloride: 102 mEq/L (ref 96–112)
Creatinine, Ser: 0.77 mg/dL (ref 0.40–1.20)
GFR: 84.66 mL/min (ref >60.00)
GLUCOSE: 105 mg/dL — AB (ref 70–99)
Potassium: 4.7 mEq/L (ref 3.5–5.1)
Sodium: 140 mEq/L (ref 135–145)

## 2018-04-19 LAB — VITAMIN D 25 HYDROXY (VIT D DEFICIENCY, FRACTURES): VITD: 33.82 ng/mL (ref 30.00–100.00)

## 2018-04-19 LAB — LIPID PANEL
Cholesterol: 192 mg/dL (ref 0–200)
HDL: 36.8 mg/dL — ABNORMAL LOW (ref >39.00)
NonHDL: 155.22
Total CHOL/HDL Ratio: 5
Triglycerides: 217 mg/dL — ABNORMAL HIGH (ref 0.0–149.0)
VLDL: 43.4 mg/dL — AB (ref 0.0–40.0)

## 2018-04-19 LAB — TSH: TSH: 0.79 u[IU]/mL (ref 0.35–4.50)

## 2018-04-19 LAB — HEPATIC FUNCTION PANEL
ALT: 32 U/L (ref 0–35)
AST: 25 U/L (ref 0–37)
Albumin: 4.6 g/dL (ref 3.5–5.2)
Alkaline Phosphatase: 67 U/L (ref 39–117)
Bilirubin, Direct: 0.1 mg/dL (ref 0.0–0.3)
TOTAL PROTEIN: 7.7 g/dL (ref 6.0–8.3)
Total Bilirubin: 0.9 mg/dL (ref 0.2–1.2)

## 2018-04-19 LAB — LDL CHOLESTEROL, DIRECT: Direct LDL: 127 mg/dL

## 2018-04-19 MED ORDER — FLUOXETINE HCL 20 MG PO CAPS: 20.0000 mg | ORAL_CAPSULE | Freq: Every day | ORAL | 0 refills | Status: DC

## 2018-04-19 MED ORDER — ALPRAZOLAM 0.25 MG PO TABS: 0.2500 mg | ORAL_TABLET | Freq: Every day | ORAL | 0 refills | Status: DC | PRN

## 2018-04-19 MED ORDER — METOPROLOL TARTRATE 25 MG PO TABS: ORAL_TABLET | ORAL | 1 refills | Status: DC

## 2018-04-19 MED ORDER — AMOXICILLIN 875 MG PO TABS: 875.0000 mg | ORAL_TABLET | Freq: Two times a day (BID) | ORAL | 0 refills | Status: DC

## 2018-04-19 NOTE — Progress Notes (Signed)
   Subjective:    Patient ID: Kayla Combs, female    DOB: 10/19/1970, 47 y.o.   MRN: [id number]  HPI CPE- UTD on pap, due for mammo (plans to do in Jan).  UTD on flu, Tdap.  Pt has gained 10 lbs since last year.    URI- pt reports she has been 'fighting it for weeks' and then this AM woke up w/ low grade temp, sore throat, bilateral ear pain, cough.  + tooth pain.     Review of Systems Patient reports no vision/ hearing changes, adenopathy,fever, weight change,  persistant/recurrent hoarseness, swallowing issues, chest pain, palpitations, edema, persistant/recurrent cough, hemoptysis, dyspnea (rest/exertional/paroxysmal nocturnal), gastrointestinal bleeding (melena, rectal bleeding), abdominal pain, significant heartburn, bowel changes, GU symptoms (dysuria, hematuria, incontinence), Gyn symptoms (abnormal  bleeding, pain),  syncope, focal weakness, memory loss, numbness & tingling, skin/hair/nail changes, abnormal bruising or bleeding, anxiety, or depression.     Objective:   Physical Exam Vitals signs reviewed.  Constitutional:      General: She is not in acute distress.    Appearance: Normal appearance. She is obese.  HENT:     Head: Normocephalic and atraumatic.     Ears:     Comments: TMs retracted bilaterally    Nose:     Comments: TTP over frontal/maxillary sinuses    Mouth/Throat:     Mouth: Mucous membranes are moist.     Pharynx: No oropharyngeal exudate or posterior oropharyngeal erythema.  Eyes:     Extraocular Movements: Extraocular movements intact.     Conjunctiva/sclera: Conjunctivae normal.     Pupils: Pupils are equal, round, and reactive to light.  Neck:     Musculoskeletal: Normal range of motion and neck supple.     Comments: No thyromegaly Cardiovascular:     Rate and Rhythm: Normal rate and regular rhythm.     Pulses: Normal pulses.     Heart sounds: Normal heart sounds. No murmur.  Pulmonary:     Effort: Pulmonary effort is normal. No  respiratory distress.     Breath sounds: Normal breath sounds. No stridor. No wheezing or rhonchi.  Abdominal:     General: Abdomen is flat. Bowel sounds are normal. There is no distension.     Palpations: Abdomen is soft.     Tenderness: There is no abdominal tenderness. There is no guarding.  Skin:    General: Skin is warm and dry.  Neurological:     General: No focal deficit present.     Mental Status: She is alert and oriented to person, place, and time.     Cranial Nerves: No cranial nerve deficit.     Sensory: No sensory deficit.     Coordination: Coordination normal.     Gait: Gait normal.     Deep Tendon Reflexes: Reflexes normal.  Psychiatric:        Mood and Affect: Mood normal.        Behavior: Behavior normal.        Thought Content: Thought content normal.        Judgment: Judgment normal.          Assessment & Plan:

## 2018-04-19 NOTE — Assessment & Plan Note (Signed)
Pt has hx of this.  Check labs and replete prn. 

## 2018-04-19 NOTE — Assessment & Plan Note (Signed)
Deteriorated.  Pt has gained 10 lbs since last visit.  Stressed need for healthy diet and regular exercise.  Check labs to risk stratify. 

## 2018-04-19 NOTE — Patient Instructions (Addendum)
Follow up in 6 months to recheck BP We'll notify you of your lab results and make any changes if needed SCHEDULE your mammogram! START the Amoxicillin twice daily- take w/ food- for the sinus infection Continue to work on healthy diet and regular exercise- you can do it! Call with any questions or concerns Happy New Year!!

## 2018-04-19 NOTE — Assessment & Plan Note (Signed)
Chronic problem.  Adequate control.  Asymptomatic.  Check labs.  No anticipated med changes.  Will follow. 

## 2018-04-19 NOTE — Assessment & Plan Note (Signed)
Pt's PE WNL w/ exception of obesity and sinusitis.  UTD on pap, due for mammo- plans to schedule.  UTD on immunizations.  Check labs.  Anticipatory guidance provided.

## 2018-05-11 DIAGNOSIS — Z1231 Encounter for screening mammogram for malignant neoplasm of breast: Secondary | ICD-10-CM | POA: Diagnosis not present

## 2018-05-11 DIAGNOSIS — Z1239 Encounter for other screening for malignant neoplasm of breast: Secondary | ICD-10-CM | POA: Diagnosis not present

## 2018-05-11 LAB — HM MAMMOGRAPHY

## 2018-05-14 ENCOUNTER — Encounter: Payer: Self-pay | Admitting: General Practice

## 2018-07-02 ENCOUNTER — Other Ambulatory Visit: Payer: Self-pay | Admitting: Family Medicine

## 2018-07-03 ENCOUNTER — Encounter: Payer: Self-pay | Admitting: Family Medicine

## 2018-07-03 ENCOUNTER — Other Ambulatory Visit: Payer: Self-pay | Admitting: Family Medicine

## 2018-07-03 MED ORDER — FLUTICASONE PROPIONATE 50 MCG/ACT NA SUSP: 1.0000 | Freq: Every day | NASAL | 1 refills | Status: DC

## 2018-07-03 MED ORDER — FLUOXETINE HCL 20 MG PO CAPS: 20.0000 mg | ORAL_CAPSULE | Freq: Every day | ORAL | 0 refills | Status: DC

## 2018-10-06 ENCOUNTER — Other Ambulatory Visit: Payer: Self-pay | Admitting: Family Medicine

## 2018-10-09 ENCOUNTER — Encounter: Payer: Self-pay | Admitting: Family Medicine

## 2018-10-09 ENCOUNTER — Other Ambulatory Visit: Payer: Self-pay | Admitting: Family Medicine

## 2018-10-09 MED ORDER — METOPROLOL TARTRATE 25 MG PO TABS: ORAL_TABLET | ORAL | 1 refills | Status: DC

## 2018-10-09 MED ORDER — FLUOXETINE HCL 20 MG PO CAPS: 20.0000 mg | ORAL_CAPSULE | Freq: Every day | ORAL | 0 refills | Status: DC

## 2018-10-10 ENCOUNTER — Other Ambulatory Visit: Payer: Self-pay | Admitting: Orthopedic Surgery

## 2018-10-10 DIAGNOSIS — L608 Other nail disorders: Secondary | ICD-10-CM | POA: Diagnosis not present

## 2018-10-11 ENCOUNTER — Other Ambulatory Visit: Payer: Self-pay

## 2018-10-11 ENCOUNTER — Encounter (HOSPITAL_BASED_OUTPATIENT_CLINIC_OR_DEPARTMENT_OTHER)
Admission: RE | Admit: 2018-10-11 | Discharge: 2018-10-11 | Disposition: A | Discharge status: Home / Self Care | Payer: BC Managed Care – PPO | Source: Ambulatory Visit | Attending: Orthopedic Surgery | Admitting: Orthopedic Surgery

## 2018-10-11 ENCOUNTER — Encounter (HOSPITAL_BASED_OUTPATIENT_CLINIC_OR_DEPARTMENT_OTHER): Payer: Self-pay | Admitting: *Deleted

## 2018-10-11 DIAGNOSIS — I10 Essential (primary) hypertension: Secondary | ICD-10-CM | POA: Insufficient documentation

## 2018-10-12 ENCOUNTER — Other Ambulatory Visit (HOSPITAL_COMMUNITY)
Admission: RE | Admit: 2018-10-12 | Discharge: 2018-10-12 | Disposition: A | Discharge status: Home / Self Care | Payer: BC Managed Care – PPO | Source: Ambulatory Visit | Attending: Orthopedic Surgery | Admitting: Orthopedic Surgery

## 2018-10-12 DIAGNOSIS — Z1159 Encounter for screening for other viral diseases: Secondary | ICD-10-CM | POA: Diagnosis not present

## 2018-10-12 LAB — SARS CORONAVIRUS 2 (TAT 6-24 HRS): SARS Coronavirus 2: NEGATIVE

## 2018-10-16 ENCOUNTER — Encounter (HOSPITAL_BASED_OUTPATIENT_CLINIC_OR_DEPARTMENT_OTHER)
Admission: RE | Disposition: A | Discharge status: Home / Self Care | Payer: Self-pay | Source: Home / Self Care | Attending: Orthopedic Surgery

## 2018-10-16 ENCOUNTER — Other Ambulatory Visit: Payer: Self-pay

## 2018-10-16 ENCOUNTER — Ambulatory Visit (HOSPITAL_BASED_OUTPATIENT_CLINIC_OR_DEPARTMENT_OTHER)
Admission: RE | Admit: 2018-10-16 | Discharge: 2018-10-16 | Disposition: A | Discharge status: Home / Self Care | Payer: BC Managed Care – PPO | Attending: Orthopedic Surgery | Admitting: Orthopedic Surgery

## 2018-10-16 ENCOUNTER — Encounter (HOSPITAL_BASED_OUTPATIENT_CLINIC_OR_DEPARTMENT_OTHER): Payer: Self-pay | Admitting: Emergency Medicine

## 2018-10-16 ENCOUNTER — Ambulatory Visit (HOSPITAL_BASED_OUTPATIENT_CLINIC_OR_DEPARTMENT_OTHER): Payer: BC Managed Care – PPO | Admitting: Anesthesiology

## 2018-10-16 DIAGNOSIS — Z87891 Personal history of nicotine dependence: Secondary | ICD-10-CM | POA: Diagnosis not present

## 2018-10-16 DIAGNOSIS — E041 Nontoxic single thyroid nodule: Secondary | ICD-10-CM | POA: Diagnosis not present

## 2018-10-16 DIAGNOSIS — L814 Other melanin hyperpigmentation: Secondary | ICD-10-CM | POA: Insufficient documentation

## 2018-10-16 DIAGNOSIS — L609 Nail disorder, unspecified: Secondary | ICD-10-CM | POA: Diagnosis not present

## 2018-10-16 DIAGNOSIS — Z886 Allergy status to analgesic agent status: Secondary | ICD-10-CM | POA: Diagnosis not present

## 2018-10-16 DIAGNOSIS — I10 Essential (primary) hypertension: Secondary | ICD-10-CM | POA: Diagnosis not present

## 2018-10-16 DIAGNOSIS — L608 Other nail disorders: Secondary | ICD-10-CM | POA: Diagnosis not present

## 2018-10-16 DIAGNOSIS — F329 Major depressive disorder, single episode, unspecified: Secondary | ICD-10-CM | POA: Diagnosis not present

## 2018-10-16 DIAGNOSIS — J45909 Unspecified asthma, uncomplicated: Secondary | ICD-10-CM | POA: Insufficient documentation

## 2018-10-16 DIAGNOSIS — E559 Vitamin D deficiency, unspecified: Secondary | ICD-10-CM | POA: Diagnosis not present

## 2018-10-16 HISTORY — DX: Nausea with vomiting, unspecified: R11.2

## 2018-10-16 HISTORY — PX: NAILBED REPAIR: SHX5028

## 2018-10-16 HISTORY — DX: Other specified postprocedural states: Z98.890

## 2018-10-16 LAB — POCT PREGNANCY, URINE: Preg Test, Ur: NEGATIVE

## 2018-10-16 SURGERY — REPAIR, NAIL BED
Anesthesia: Regional | Site: Finger | Laterality: Right

## 2018-10-16 MED ORDER — BUPIVACAINE HCL (PF) 0.25 % IJ SOLN
INTRAMUSCULAR | Status: DC | PRN
  Administered 2018-10-16: 7 mL

## 2018-10-16 MED ORDER — 0.9 % SODIUM CHLORIDE (POUR BTL) OPTIME
TOPICAL | Status: DC | PRN
  Administered 2018-10-16: 09:00:00 100 mL

## 2018-10-16 MED ORDER — LACTATED RINGERS IV SOLN: INTRAVENOUS | Status: DC

## 2018-10-16 MED ORDER — ONDANSETRON HCL 4 MG/2ML IJ SOLN
INTRAMUSCULAR | Status: DC | PRN
  Administered 2018-10-16: 4 mg via INTRAVENOUS

## 2018-10-16 MED ORDER — MEPERIDINE HCL 25 MG/ML IJ SOLN: 6.2500 mg | INTRAMUSCULAR | Status: DC | PRN

## 2018-10-16 MED ORDER — MIDAZOLAM HCL 2 MG/2ML IJ SOLN
1.0000 mg | INTRAMUSCULAR | Status: DC | PRN
  Administered 2018-10-16: 2 mg via INTRAVENOUS

## 2018-10-16 MED ORDER — PROPOFOL 10 MG/ML IV BOLUS
INTRAVENOUS | Status: DC | PRN
  Administered 2018-10-16 (×6): 20 mg via INTRAVENOUS

## 2018-10-16 MED ORDER — LACTATED RINGERS IV SOLN
INTRAVENOUS | Status: DC
  Administered 2018-10-16 (×2): via INTRAVENOUS

## 2018-10-16 MED ORDER — FENTANYL CITRATE (PF) 100 MCG/2ML IJ SOLN
INTRAMUSCULAR | Status: AC
  Filled 2018-10-16: qty 2

## 2018-10-16 MED ORDER — BUPIVACAINE HCL (PF) 0.25 % IJ SOLN
INTRAMUSCULAR | Status: AC
  Filled 2018-10-16: qty 30

## 2018-10-16 MED ORDER — FENTANYL CITRATE (PF) 100 MCG/2ML IJ SOLN
50.0000 ug | INTRAMUSCULAR | Status: DC | PRN
  Administered 2018-10-16: 100 ug via INTRAVENOUS

## 2018-10-16 MED ORDER — MIDAZOLAM HCL 2 MG/2ML IJ SOLN
INTRAMUSCULAR | Status: AC
  Filled 2018-10-16: qty 2

## 2018-10-16 MED ORDER — METOCLOPRAMIDE HCL 5 MG/ML IJ SOLN: 10.0000 mg | Freq: Once | INTRAMUSCULAR | Status: DC | PRN

## 2018-10-16 MED ORDER — TRAMADOL HCL 50 MG PO TABS: 50.0000 mg | ORAL_TABLET | Freq: Four times a day (QID) | ORAL | 0 refills | Status: DC | PRN

## 2018-10-16 MED ORDER — SCOPOLAMINE 1 MG/3DAYS TD PT72: 1.0000 | MEDICATED_PATCH | Freq: Once | TRANSDERMAL | Status: DC

## 2018-10-16 MED ORDER — FENTANYL CITRATE (PF) 100 MCG/2ML IJ SOLN: 25.0000 ug | INTRAMUSCULAR | Status: DC | PRN

## 2018-10-16 MED ORDER — CHLORHEXIDINE GLUCONATE 4 % EX LIQD: 60.0000 mL | Freq: Once | CUTANEOUS | Status: DC

## 2018-10-16 MED ORDER — CEFAZOLIN SODIUM-DEXTROSE 2-4 GM/100ML-% IV SOLN
2.0000 g | INTRAVENOUS | Status: AC
  Administered 2018-10-16: 2 g via INTRAVENOUS

## 2018-10-16 MED ORDER — CEFAZOLIN SODIUM-DEXTROSE 2-4 GM/100ML-% IV SOLN
INTRAVENOUS | Status: AC
  Filled 2018-10-16: qty 100

## 2018-10-16 SURGICAL SUPPLY — 40 items
BLADE MINI RND TIP GREEN BEAV (BLADE) ×1 IMPLANT
BLADE SURG 15 STRL LF DISP TIS (BLADE) ×1 IMPLANT
BLADE SURG 15 STRL SS (BLADE) ×1
BNDG CMPR 9X4 STRL LF SNTH (GAUZE/BANDAGES/DRESSINGS)
BNDG COHESIVE 1X5 TAN STRL LF (GAUZE/BANDAGES/DRESSINGS) ×1 IMPLANT
BNDG ESMARK 4X9 LF (GAUZE/BANDAGES/DRESSINGS) IMPLANT
CHLORAPREP W/TINT 26 (MISCELLANEOUS) ×2 IMPLANT
CORD BIPOLAR FORCEPS 12FT (ELECTRODE) ×2 IMPLANT
COVER BACK TABLE REUSABLE LG (DRAPES) ×2 IMPLANT
COVER MAYO STAND REUSABLE (DRAPES) ×2 IMPLANT
COVER WAND RF STERILE (DRAPES) IMPLANT
CUFF TOURN SGL QUICK 18X4 (TOURNIQUET CUFF) ×1 IMPLANT
DRAPE EXTREMITY T 121X128X90 (DISPOSABLE) ×2 IMPLANT
DRAPE SURG 17X23 STRL (DRAPES) ×2 IMPLANT
GAUZE SPONGE 4X4 12PLY STRL (GAUZE/BANDAGES/DRESSINGS) ×2 IMPLANT
GAUZE XEROFORM 1X8 LF (GAUZE/BANDAGES/DRESSINGS) ×2 IMPLANT
GLOVE BIO SURGEON STRL SZ 6.5 (GLOVE) ×1 IMPLANT
GLOVE BIOGEL PI IND STRL 7.0 (GLOVE) IMPLANT
GLOVE BIOGEL PI IND STRL 8.5 (GLOVE) ×1 IMPLANT
GLOVE BIOGEL PI INDICATOR 7.0 (GLOVE) ×1
GLOVE BIOGEL PI INDICATOR 8.5 (GLOVE) ×1
GLOVE SURG ORTHO 8.0 STRL STRW (GLOVE) ×2 IMPLANT
GOWN STRL REUS W/ TWL LRG LVL3 (GOWN DISPOSABLE) ×1 IMPLANT
GOWN STRL REUS W/TWL LRG LVL3 (GOWN DISPOSABLE) ×1
GOWN STRL REUS W/TWL XL LVL3 (GOWN DISPOSABLE) ×2 IMPLANT
NDL PRECISIONGLIDE 27X1.5 (NEEDLE) IMPLANT
NEEDLE PRECISIONGLIDE 27X1.5 (NEEDLE) IMPLANT
NS IRRIG 1000ML POUR BTL (IV SOLUTION) ×2 IMPLANT
PACK BASIN DAY SURGERY FS (CUSTOM PROCEDURE TRAY) ×2 IMPLANT
PADDING CAST ABS 4INX4YD NS (CAST SUPPLIES) ×1
PADDING CAST ABS COTTON 4X4 ST (CAST SUPPLIES) ×1 IMPLANT
SPLINT FINGER 3.25 BULB 911905 (SOFTGOODS) ×1 IMPLANT
STOCKINETTE 4X48 STRL (DRAPES) ×2 IMPLANT
SUT CHROMIC 6 0 G 1 (SUTURE) ×1 IMPLANT
SUT ETHILON 4 0 PS 2 18 (SUTURE) ×1 IMPLANT
SUT VIC AB 4-0 P2 18 (SUTURE) IMPLANT
SYR BULB 3OZ (MISCELLANEOUS) ×2 IMPLANT
SYR CONTROL 10ML LL (SYRINGE) ×1 IMPLANT
TOWEL GREEN STERILE FF (TOWEL DISPOSABLE) ×4 IMPLANT
UNDERPAD 30X30 (UNDERPADS AND DIAPERS) ×2 IMPLANT

## 2018-10-16 NOTE — Discharge Instructions (Addendum)

## 2018-10-16 NOTE — H&P (Signed)
Kayla Combs is an 48 y.o. female.   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 She states this is been present for approximately 6 months. She recalls no history of injury. It is not painful for her. It has not changed other than becoming more dark in color. Is no history of diabetes thyroid problems arthritis or gout. Family history is negative for each of these also   Past Medical History:  Diagnosis Date  . Asthma   . Cough   . Depression   . Hyperlipidemia   . Hypertension   . Migraine headache   . PONV (postoperative nausea and vomiting)   . Sinusitis     Past Surgical History:  Procedure Laterality Date  . CESAREAN SECTION     x 3   . TONSILLECTOMY AND ADENOIDECTOMY      Family History  Problem Relation Age of Onset  . Cervical cancer Mother    Social History:  reports that she quit smoking about 7 years ago. Her smoking use included cigarettes. She has a 3.00 pack-year smoking history. She has never used smokeless tobacco. She reports current alcohol use. She reports that she does not use drugs.  Allergies:  Allergies  Allergen Reactions  . Aleve [Naproxen Sodium] Itching    No medications prior to admission.    No results found for this or any previous visit (from the past 48 hour(s)).  No results found.   Pertinent items are noted in HPI.  Height 5\' 3"  (1.6 m), weight 118 kg, last menstrual period 09/26/2018.  General appearance: alert, cooperative and appears stated age Head: Normocephalic, without obvious abnormality Neck: no JVD Resp: clear to auscultation bilaterally Cardio: regular rate and rhythm, S1, S2 normal, no murmur, click, rub or gallop GI: soft, non-tender; bowel sounds normal; no masses,  no organomegaly Extremities: Melanotic streak  nailbed right middle finger Pulses: 2+ and symmetric Skin: Skin color, texture, turgor normal. No rashes or lesions Neurologic: Grossly normal Incision/Wound: na  Assessment/Plan Assessment:  1. Melanonychia ORTHO XR HAND 2 VW UNILATERAL Right; AP, Lateral    Plan: We have discussed biopsying this with her. She would like to have that done. Pre-peri-postoperative course are discussed along with risks and complications including nail deformity possibility of recurrence advised that this is unlikely to be a melanoma but biopsies in the way to confirm this and she would like to have it done. This scheduled as an outpatient right middle finger biopsy nail matrix.   Daryll Brod 10/16/2018, 6:29 AM

## 2018-10-16 NOTE — Anesthesia Postprocedure Evaluation (Signed)
Anesthesia Post Note  Patient: Kayla Combs  Procedure(s) Performed: BIOPSY RIGHT NAILBED MIDDLE FINGER (Right Finger)     Patient location during evaluation: PACU Anesthesia Type: Bier Block Level of consciousness: awake and alert Pain management: pain level controlled Vital Signs Assessment: post-procedure vital signs reviewed and stable Respiratory status: spontaneous breathing, nonlabored ventilation, respiratory function stable and patient connected to nasal cannula oxygen Cardiovascular status: stable and blood pressure returned to baseline Postop Assessment: no apparent nausea or vomiting Anesthetic complications: no    Last Vitals:  Vitals:   10/16/18 0956 10/16/18 1057  BP: 110/67 114/71  Pulse: 67 66  Resp: 15 16  Temp:  36.9 C  SpO2: 96% 97%    Last Pain:  Vitals:   10/16/18 1057  TempSrc: Oral  PainSc: 0-No pain                 Montez Hageman

## 2018-10-16 NOTE — Op Note (Signed)
NAME: Kayla Combs MEDICAL RECORD NO: [id number] DATE OF BIRTH: [birth year] FACILITY: [facility] LOCATION: [facility] PHYSICIAN: Wynonia Sours, MD   OPERATIVE REPORT   DATE OF PROCEDURE: 10/16/18    PREOPERATIVE DIAGNOSIS:   Pigmented lesion nailbed right middle finger   POSTOPERATIVE DIAGNOSIS:   Same   PROCEDURE:   Biopsy nail bed right middle finger   SURGEON: Daryll Brod, M.D.   ASSISTANT: none   ANESTHESIA:  Bier block with sedation and Local   INTRAVENOUS FLUIDS:  Per anesthesia flow sheet.   ESTIMATED BLOOD LOSS:  Minimal.   COMPLICATIONS:  None.   SPECIMENS:   Nailbed and nail plate   TOURNIQUET TIME:   * Missing tourniquet times found for documented tourniquets in log: [id number] *   DISPOSITION:  Stable to PACU.   INDICATIONS: Patient is a 48 year old female with a history of a pigmented lesion longitudinally arranged on her nailbed right middle finger which is been present for years it is the full length of the nail matrix.  She is desirous having this biopsy.  Pre-peri-and postoperative course been discussed along with risks and complications.  She is aware there is no guarantee to the surgery possibility of infection recurrence of nail deformity possibility of it being more than a benign lesion.  In preoperative area the patient seen extremity marked by both patient and surgeon antibiotic given  OPERATIVE COURSE: She was brought to the operating room where a forearm-based IV regional anesthetic was carried out without difficulty.  She was prepped and draped supine position with the right arm free.  Three-minute dry time was allowed after the ChloraPrep prep.  A timeout was taken confirming patient procedure.  A metacarpal block was given quarter percent bupivacaine without epinephrine approximately 8 cc was used.  The nail plate was removed after marking the area of the pigmented position distally.  This allowed visualization of the streak going  down the nail to the proximal fold.  Retractors were placed into the proximal fold and the lesion was excised and sent to pathology along with the nail plate.  The nail plate was sent for culture and for  KOH stain.  The wound was irrigated the nail matrix was undermined with a Beaver blade and repaired with interrupted 6-0 chromic sutures.  Nonadherent gauze was laced in the proximal nail fold along with a covering nonadherent gauze and a sterile compressive dressing splint applied to the finger and the patient was taken to the recovery room for observation in satisfactory condition on deflation of the tourniquet all fingers immediately pink.  She is discharged home to return the hand center [facility] in 1 week on Tylenol ibuprofen for pain and Ultram as a backup breakthrough medicine.   Daryll Brod, MD Electronically signed, 10/16/18

## 2018-10-16 NOTE — Transfer of Care (Signed)
Immediate Anesthesia Transfer of Care Note  Patient: Kayla Combs  Procedure(s) Performed: BIOPSY RIGHT NAILBED MIDDLE FINGER (Right )  Patient Location: PACU  Anesthesia Type:MAC and Bier block  Level of Consciousness: awake, alert  and oriented  Airway & Oxygen Therapy: Patient Spontanous Breathing and Patient connected to nasal cannula oxygen  Post-op Assessment: Report given to RN and Post -op Vital signs reviewed and stable  Post vital signs: Reviewed and stable  Last Vitals:  Vitals Value Taken Time  BP    Temp    Pulse 69 10/16/18 0930  Resp 20 10/16/18 0930  SpO2 98 % 10/16/18 0930  Vitals shown include unvalidated device data.  Last Pain:  Vitals:   10/16/18 0751  TempSrc: Oral  PainSc: 0-No pain         Complications: No apparent anesthesia complications

## 2018-10-16 NOTE — Brief Op Note (Signed)
10/16/2018  9:27 AM  PATIENT:  Kayla Combs  48 y.o. female  PRE-OPERATIVE DIAGNOSIS:  MELONYCHIA RIGHT MIDDLE FINGER  POST-OPERATIVE DIAGNOSIS:  MELONYCHIA RIGHT MIDDLE FINGER  PROCEDURE:  Procedure(s) with comments: BIOPSY RIGHT NAILBED MIDDLE FINGER (Right) - IV REGIONAL FOREARM BLOCK  SURGEON:  Surgeon(s) and Role:    * Daryll Brod, MD - Primary  PHYSICIAN ASSISTANT:   ASSISTANTS: none   ANESTHESIA:   local, regional and IV sedation  EBL:  5 mL   BLOOD ADMINISTERED:none  DRAINS: none   LOCAL MEDICATIONS USED:  BUPIVICAINE   SPECIMEN:  Excision  DISPOSITION OF SPECIMEN:  PATHOLOGY  COUNTS:  YES  TOURNIQUET:  * Missing tourniquet times found for documented tourniquets in log: [id number] *  DICTATION: .Dragon Dictation  PLAN OF CARE: Discharge to home after PACU  PATIENT DISPOSITION:  PACU - hemodynamically stable.

## 2018-10-16 NOTE — Anesthesia Preprocedure Evaluation (Signed)
Anesthesia Evaluation  Patient identified by MRN, date of birth, ID band Patient awake    Reviewed: Allergy & Precautions, NPO status , Patient's Chart, lab work & pertinent test results  History of Anesthesia Complications (+) PONV  Airway Mallampati: II  TM Distance: >3 FB Neck ROM: Full    Dental no notable dental hx.    Pulmonary neg pulmonary ROS, former smoker,    Pulmonary exam normal breath sounds clear to auscultation       Cardiovascular hypertension, Pt. on medications and Pt. on home beta blockers Normal cardiovascular exam Rhythm:Regular Rate:Normal     Neuro/Psych negative neurological ROS  negative psych ROS   GI/Hepatic negative GI ROS, Neg liver ROS,   Endo/Other  negative endocrine ROS  Renal/GU negative Renal ROS  negative genitourinary   Musculoskeletal negative musculoskeletal ROS (+)   Abdominal   Peds negative pediatric ROS (+)  Hematology negative hematology ROS (+)   Anesthesia Other Findings   Reproductive/Obstetrics negative OB ROS                             Anesthesia Physical Anesthesia Plan  ASA: II  Anesthesia Plan: Bier Block and Bier Block-LIDOCAINE ONLY   Post-op Pain Management:    Induction:   PONV Risk Score and Plan: 3 and Treatment may vary due to age or medical condition  Airway Management Planned: Simple Face Mask and Nasal Cannula  Additional Equipment:   Intra-op Plan:   Post-operative Plan:   Informed Consent: I have reviewed the patients History and Physical, chart, labs and discussed the procedure including the risks, benefits and alternatives for the proposed anesthesia with the patient or authorized representative who has indicated his/her understanding and acceptance.     Dental advisory given  Plan Discussed with:   Anesthesia Plan Comments:         Anesthesia Quick Evaluation

## 2018-10-17 ENCOUNTER — Encounter (HOSPITAL_BASED_OUTPATIENT_CLINIC_OR_DEPARTMENT_OTHER): Payer: Self-pay | Admitting: Orthopedic Surgery

## 2018-11-08 LAB — FUNGUS CULTURE WITH STAIN

## 2018-11-08 LAB — FUNGUS CULTURE RESULT

## 2018-11-08 LAB — FUNGAL ORGANISM REFLEX

## 2018-11-14 DIAGNOSIS — L608 Other nail disorders: Secondary | ICD-10-CM | POA: Diagnosis not present

## 2019-01-06 ENCOUNTER — Other Ambulatory Visit: Payer: Self-pay | Admitting: Family Medicine

## 2019-01-07 MED ORDER — FLUOXETINE HCL 20 MG PO CAPS: 20.0000 mg | ORAL_CAPSULE | Freq: Every day | ORAL | 0 refills | Status: DC

## 2019-01-10 ENCOUNTER — Encounter: Payer: Self-pay | Admitting: Family Medicine

## 2019-01-11 ENCOUNTER — Encounter: Payer: Self-pay | Admitting: Family Medicine

## 2019-01-11 MED ORDER — FLUOXETINE HCL 20 MG PO CAPS: 20.0000 mg | ORAL_CAPSULE | Freq: Every day | ORAL | 0 refills | Status: DC

## 2019-03-27 ENCOUNTER — Other Ambulatory Visit: Payer: Self-pay | Admitting: General Practice

## 2019-03-27 MED ORDER — METOPROLOL TARTRATE 25 MG PO TABS: 50.0000 mg | ORAL_TABLET | Freq: Two times a day (BID) | ORAL | 0 refills | Status: DC

## 2019-03-29 ENCOUNTER — Other Ambulatory Visit: Payer: Self-pay

## 2019-04-05 ENCOUNTER — Ambulatory Visit (INDEPENDENT_AMBULATORY_CARE_PROVIDER_SITE_OTHER): Payer: BC Managed Care – PPO | Admitting: Family Medicine

## 2019-04-05 ENCOUNTER — Other Ambulatory Visit: Payer: Self-pay

## 2019-04-05 ENCOUNTER — Encounter: Payer: Self-pay | Admitting: Family Medicine

## 2019-04-05 DIAGNOSIS — M545 Low back pain, unspecified: Secondary | ICD-10-CM

## 2019-04-05 DIAGNOSIS — G8929 Other chronic pain: Secondary | ICD-10-CM

## 2019-04-05 MED ORDER — PREDNISONE 10 MG PO TABS: ORAL_TABLET | ORAL | 0 refills | Status: DC

## 2019-04-05 MED ORDER — CYCLOBENZAPRINE HCL 10 MG PO TABS: 10.0000 mg | ORAL_TABLET | Freq: Three times a day (TID) | ORAL | 0 refills | Status: DC | PRN

## 2019-04-05 NOTE — Progress Notes (Signed)
Virtual Visit via Video   I connected with patient on 04/05/19 at  4:00 PM EST by a video enabled telemedicine application and verified that I am speaking with the correct person using two identifiers.  Location patient: Home Location provider: Acupuncturist, Office Persons participating in the virtual visit: Patient, Provider, [place] (Jess B)  I discussed the limitations of evaluation and management by telemedicine and the availability of in person appointments. The patient expressed understanding and agreed to proceed.  Subjective:   HPI:  Back pain- returned to work in January and is standing all day in the OR.  A few years earlier thought she had sciatica and had an injxn at [facility] but continued to have intermittent pain.  Recently developed heat, tingling and numbness of L side (to L of spine), LBP  .  Will have intermittent stabbing pain.  Changed shoes, tried to stretch.  Initially would come and go but w/ prolonged standing would return.  Is now 'burning and numb no matter what'.  Starts each day w/ 2 Advil w/ minimal improvement.  Temporary relief w/ heating pad.  ROS:   See pertinent positives and negatives per HPI.  Patient Active Problem List   Diagnosis Date Noted  . Vitamin D deficiency 04/19/2018  . Thyroid nodule 01/21/2015  . Hypocalcemia 01/21/2015  . Needs sleep apnea assessment 01/08/2015  . Pap smear for cervical cancer screening 04/03/2014  . Cervical polyp 04/03/2014  . Severe obesity (BMI >= 40) ([place]) 04/02/2013  . Insomnia 11/27/2012  . Tachycardia 04/30/2012  . Routine general medical examination at a health care facility 03/06/2012  . Hypertriglyceridemia 03/06/2012  . HTN (hypertension) 03/06/2012  . Depression with anxiety 03/06/2012  . Sciatica of left side 12/17/2008    Social History   Tobacco Use  . Smoking status: Former Smoker    Packs/day: 0.30    Years: 10.00    Pack years: 3.00    Types: Cigarettes    Quit date: 03/26/2011    Years  since quitting: 8.0  . Smokeless tobacco: Never Used  Substance Use Topics  . Alcohol use: Yes    Alcohol/week: 0.0 - 4.0 standard drinks    Current Outpatient Medications:  .  ALPRAZolam (XANAX) 0.25 MG tablet, Take 1 tablet (0.25 mg total) by mouth daily as needed for anxiety., Disp: 90 tablet, Rfl: 0 .  Cholecalciferol (VITAMIN D) 2000 UNITS tablet, Take 2,000 Units by mouth daily., Disp: , Rfl:  .  FLUoxetine (PROZAC) 20 MG capsule, Take 1 capsule (20 mg total) by mouth daily., Disp: 90 capsule, Rfl: 0 .  fluticasone (FLONASE) 50 MCG/ACT nasal spray, Place 1 spray into both nostrils daily., Disp: 48 g, Rfl: 1 .  ibuprofen (ADVIL,MOTRIN) 200 MG tablet, Take 200 mg by mouth every 6 (six) hours as needed for moderate pain. , Disp: , Rfl:  .  metoprolol tartrate (LOPRESSOR) 25 MG tablet, Take 2 tablets (50 mg total) by mouth 2 (two) times daily., Disp: 360 tablet, Rfl: 0 .  traMADol (ULTRAM) 50 MG tablet, Take 1 tablet (50 mg total) by mouth every 6 (six) hours as needed., Disp: 20 tablet, Rfl: 0  Allergies  Allergen Reactions  . Aleve [Naproxen Sodium] Itching    Objective:   There were no vitals taken for this visit. AAOx3, NAD NCAT, EOMI No obvious CN deficits Coloring WNL Pt is able to speak clearly, coherently without shortness of breath or increased work of breathing.  Thought process is linear.  Mood is  appropriate.   Assessment and Plan:   LBP- pt reports sxs started ~1 yr ago as intermittent warmth and burning of L lower back.  This has progressed to a constant warmth and burning w/ intermittent stabbing pains.  The quality of her pain sounds nerve related.  Since I am unable to examine her I cannot determine whether this is due to spinal issues or paraspinal muscle spasm.  Will start Pred taper, muscle relaxers, and refer to Dr Venetia Maxon (pt's preference).  Reviewed supportive care and red flags that should prompt return.  Pt expressed understanding and is in agreement w/ plan.    Neena Rhymes, MD 04/05/2019

## 2019-04-05 NOTE — Progress Notes (Signed)
I have discussed the procedure for the virtual visit with the patient who has given consent to proceed with assessment and treatment.   Pt unable to obtain vitals.   Kayla Combs, CMA     

## 2019-04-22 ENCOUNTER — Encounter: Payer: Self-pay | Admitting: Gastroenterology

## 2019-04-22 ENCOUNTER — Encounter: Payer: Self-pay | Admitting: Family Medicine

## 2019-04-22 ENCOUNTER — Ambulatory Visit (INDEPENDENT_AMBULATORY_CARE_PROVIDER_SITE_OTHER): Payer: BC Managed Care – PPO | Admitting: Family Medicine

## 2019-04-22 ENCOUNTER — Other Ambulatory Visit: Payer: Self-pay

## 2019-04-22 VITALS — BP 132/72 | HR 86 | Ht 63.5 in | Wt 258.0 lb

## 2019-04-22 DIAGNOSIS — Z0000 Encounter for general adult medical examination without abnormal findings: Secondary | ICD-10-CM | POA: Diagnosis not present

## 2019-04-22 DIAGNOSIS — E559 Vitamin D deficiency, unspecified: Secondary | ICD-10-CM

## 2019-04-22 DIAGNOSIS — Z1211 Encounter for screening for malignant neoplasm of colon: Secondary | ICD-10-CM | POA: Diagnosis not present

## 2019-04-22 MED ORDER — ALPRAZOLAM 0.25 MG PO TABS: 0.2500 mg | ORAL_TABLET | Freq: Every day | ORAL | 0 refills | Status: DC | PRN

## 2019-04-22 NOTE — Progress Notes (Signed)
Virtual Visit via Video   I connected with patient on 04/22/19 at  2:00 PM EST by a video enabled telemedicine application and verified that I am speaking with the correct person using two identifiers.  Location patient: Home Location provider: Acupuncturist, Office Persons participating in the virtual visit: Patient, Provider, [place] (Jess B)  I discussed the limitations of evaluation and management by telemedicine and the availability of in person appointments. The patient expressed understanding and agreed to proceed.  Subjective:   HPI:   CPE- UTD on mammo, pap, Tdap, flu.  Due for GI referral.  ROS:  Patient reports no vision/ hearing changes, adenopathy,fever, weight change,  persistant/recurrent hoarseness , swallowing issues, chest pain, palpitations, edema, persistant/recurrent cough, hemoptysis, dyspnea (rest/exertional/paroxysmal nocturnal), gastrointestinal bleeding (melena, rectal bleeding), abdominal pain, significant heartburn, bowel changes, GU symptoms (dysuria, hematuria, incontinence), Gyn symptoms (abnormal  bleeding, pain),  syncope, focal weakness, memory loss, numbness & tingling, skin/hair/nail changes, abnormal bruising or bleeding, anxiety, or depression.   Patient Active Problem List   Diagnosis Date Noted  . Vitamin D deficiency 04/19/2018  . Thyroid nodule 01/21/2015  . Hypocalcemia 01/21/2015  . Needs sleep apnea assessment 01/08/2015  . Pap smear for cervical cancer screening 04/03/2014  . Cervical polyp 04/03/2014  . Severe obesity (BMI >= 40) ([place]) 04/02/2013  . Insomnia 11/27/2012  . Tachycardia 04/30/2012  . Routine general medical examination at a health care facility 03/06/2012  . Hypertriglyceridemia 03/06/2012  . HTN (hypertension) 03/06/2012  . Depression with anxiety 03/06/2012  . Sciatica of left side 12/17/2008    Social History   Tobacco Use  . Smoking status: Former Smoker    Packs/day: 0.30    Years: 10.00    Pack years:  3.00    Types: Cigarettes    Quit date: 03/26/2011    Years since quitting: 8.0  . Smokeless tobacco: Never Used  Substance Use Topics  . Alcohol use: Yes    Alcohol/week: 0.0 - 4.0 standard drinks    Current Outpatient Medications:  .  ALPRAZolam (XANAX) 0.25 MG tablet, Take 1 tablet (0.25 mg total) by mouth daily as needed for anxiety., Disp: 90 tablet, Rfl: 0 .  Cholecalciferol (VITAMIN D) 2000 UNITS tablet, Take 2,000 Units by mouth daily., Disp: , Rfl:  .  cyclobenzaprine (FLEXERIL) 10 MG tablet, Take 1 tablet (10 mg total) by mouth 3 (three) times daily as needed for muscle spasms., Disp: 30 tablet, Rfl: 0 .  fluticasone (FLONASE) 50 MCG/ACT nasal spray, Place 1 spray into both nostrils daily., Disp: 48 g, Rfl: 1 .  ibuprofen (ADVIL,MOTRIN) 200 MG tablet, Take 200 mg by mouth every 6 (six) hours as needed for moderate pain. , Disp: , Rfl:  .  metoprolol tartrate (LOPRESSOR) 25 MG tablet, Take 2 tablets (50 mg total) by mouth 2 (two) times daily., Disp: 360 tablet, Rfl: 0 .  predniSONE (DELTASONE) 10 MG tablet, 3 tabs x3 days and then 2 tabs x3 days and then 1 tab x3 days.  Take w/ food., Disp: 18 tablet, Rfl: 0 .  traMADol (ULTRAM) 50 MG tablet, Take 1 tablet (50 mg total) by mouth every 6 (six) hours as needed., Disp: 20 tablet, Rfl: 0 .  FLUoxetine (PROZAC) 20 MG capsule, Take 1 capsule (20 mg total) by mouth daily. (Patient not taking: Reported on 04/22/2019), Disp: 90 capsule, Rfl: 0  Allergies  Allergen Reactions  . Aleve [Naproxen Sodium] Itching    Objective:   BP 132/72   Pulse 86  Ht 5' 3.5" (1.613 m)   Wt 258 lb (117 kg)   BMI 44.99 kg/m  AAOx3, NAD obese NCAT, EOMI No obvious CN deficits Coloring WNL Pt is able to speak clearly, coherently without shortness of breath or increased work of breathing.  Thought process is linear.  Mood is appropriate.   Assessment and Plan:   CPE- UTD on pap, mammo, immunizations.  Referral placed for colonoscopy.  Limited  video PE WNL w/ exception of obesity.  Check labs.  Anticipatory guidance provided.    Neena Rhymes, MD 04/22/2019

## 2019-04-22 NOTE — Progress Notes (Signed)
I have discussed the procedure for the virtual visit with the patient who has given consent to proceed with assessment and treatment.   Daxten Kovalenko L Kelson Queenan, CMA     

## 2019-04-24 ENCOUNTER — Encounter: Payer: Self-pay | Admitting: Family Medicine

## 2019-04-24 ENCOUNTER — Encounter: Payer: BC Managed Care – PPO | Admitting: Family Medicine

## 2019-04-29 ENCOUNTER — Other Ambulatory Visit: Payer: Self-pay | Admitting: General Practice

## 2019-04-29 ENCOUNTER — Other Ambulatory Visit (INDEPENDENT_AMBULATORY_CARE_PROVIDER_SITE_OTHER): Payer: BC Managed Care – PPO

## 2019-04-29 DIAGNOSIS — E559 Vitamin D deficiency, unspecified: Secondary | ICD-10-CM

## 2019-04-29 LAB — BASIC METABOLIC PANEL
BUN: 12 mg/dL (ref 6–23)
CO2: 23 mEq/L (ref 19–32)
Calcium: 9.6 mg/dL (ref 8.4–10.5)
Chloride: 103 mEq/L (ref 96–112)
Creatinine, Ser: 0.6 mg/dL (ref 0.40–1.20)
GFR: 105.78 mL/min (ref >60.00)
Glucose, Bld: 148 mg/dL — ABNORMAL HIGH (ref 70–99)
Potassium: 3.9 mEq/L (ref 3.5–5.1)
Sodium: 139 mEq/L (ref 135–145)

## 2019-04-29 LAB — HEMOGLOBIN A1C: Hgb A1c MFr Bld: 5.7 % (ref 4.6–6.5)

## 2019-04-29 LAB — HEPATIC FUNCTION PANEL
ALT: 26 U/L (ref 0–35)
AST: 20 U/L (ref 0–37)
Albumin: 4.2 g/dL (ref 3.5–5.2)
Alkaline Phosphatase: 69 U/L (ref 39–117)
Bilirubin, Direct: 0.1 mg/dL (ref 0.0–0.3)
Total Bilirubin: 0.7 mg/dL (ref 0.2–1.2)
Total Protein: 7.2 g/dL (ref 6.0–8.3)

## 2019-04-29 LAB — CBC WITH DIFFERENTIAL/PLATELET
Basophils Absolute: 0.1 10*3/uL (ref 0.0–0.1)
Basophils Relative: 0.8 % (ref 0.0–3.0)
Eosinophils Absolute: 0.4 10*3/uL (ref 0.0–0.7)
Eosinophils Relative: 3.2 % (ref 0.0–5.0)
HCT: 43 % (ref 36.0–46.0)
Hemoglobin: 14.5 g/dL (ref 12.0–15.0)
Lymphocytes Relative: 19.8 % (ref 12.0–46.0)
Lymphs Abs: 2.5 10*3/uL (ref 0.7–4.0)
MCHC: 33.7 g/dL (ref 30.0–36.0)
MCV: 84.8 fl (ref 78.0–100.0)
Monocytes Absolute: 0.8 10*3/uL (ref 0.1–1.0)
Monocytes Relative: 6.6 % (ref 3.0–12.0)
Neutro Abs: 8.7 10*3/uL — ABNORMAL HIGH (ref 1.4–7.7)
Neutrophils Relative %: 69.6 % (ref 43.0–77.0)
Platelets: 372 10*3/uL (ref 150.0–400.0)
RBC: 5.07 Mil/uL (ref 3.87–5.11)
RDW: 13 % (ref 11.5–15.5)
WBC: 12.6 10*3/uL — ABNORMAL HIGH (ref 4.0–10.5)

## 2019-04-29 LAB — LIPID PANEL
Cholesterol: 206 mg/dL — ABNORMAL HIGH (ref 0–200)
HDL: 41 mg/dL (ref >39.00)
NonHDL: 164.7
Total CHOL/HDL Ratio: 5
Triglycerides: 257 mg/dL — ABNORMAL HIGH (ref 0.0–149.0)
VLDL: 51.4 mg/dL — ABNORMAL HIGH (ref 0.0–40.0)

## 2019-04-29 LAB — VITAMIN D 25 HYDROXY (VIT D DEFICIENCY, FRACTURES): VITD: 24.82 ng/mL — ABNORMAL LOW (ref 30.00–100.00)

## 2019-04-29 LAB — TSH: TSH: 1.84 u[IU]/mL (ref 0.35–4.50)

## 2019-04-29 LAB — LDL CHOLESTEROL, DIRECT: Direct LDL: 122 mg/dL

## 2019-04-29 MED ORDER — VITAMIN D (ERGOCALCIFEROL) 1.25 MG (50000 UNIT) PO CAPS: 50000.0000 [IU] | ORAL_CAPSULE | ORAL | 0 refills | Status: DC

## 2019-04-30 ENCOUNTER — Telehealth: Payer: Self-pay

## 2019-04-30 NOTE — Telephone Encounter (Signed)
LMOVM to schedule 6 month BP f/u per PCP

## 2019-05-09 DIAGNOSIS — H52203 Unspecified astigmatism, bilateral: Secondary | ICD-10-CM | POA: Diagnosis not present

## 2019-05-09 DIAGNOSIS — H5213 Myopia, bilateral: Secondary | ICD-10-CM | POA: Diagnosis not present

## 2019-05-09 DIAGNOSIS — H524 Presbyopia: Secondary | ICD-10-CM | POA: Diagnosis not present

## 2019-05-13 DIAGNOSIS — M47816 Spondylosis without myelopathy or radiculopathy, lumbar region: Secondary | ICD-10-CM | POA: Diagnosis not present

## 2019-05-13 DIAGNOSIS — I10 Essential (primary) hypertension: Secondary | ICD-10-CM | POA: Diagnosis not present

## 2019-05-13 DIAGNOSIS — M545 Low back pain, unspecified: Secondary | ICD-10-CM | POA: Insufficient documentation

## 2019-05-13 DIAGNOSIS — M5416 Radiculopathy, lumbar region: Secondary | ICD-10-CM | POA: Insufficient documentation

## 2019-05-20 ENCOUNTER — Other Ambulatory Visit: Payer: Self-pay

## 2019-05-20 ENCOUNTER — Ambulatory Visit (AMBULATORY_SURGERY_CENTER): Payer: Self-pay | Admitting: *Deleted

## 2019-05-20 VITALS — Temp 96.8°F | Ht 63.5 in | Wt 267.0 lb

## 2019-05-20 DIAGNOSIS — Z1211 Encounter for screening for malignant neoplasm of colon: Secondary | ICD-10-CM

## 2019-05-20 DIAGNOSIS — Z01818 Encounter for other preprocedural examination: Secondary | ICD-10-CM

## 2019-05-20 MED ORDER — SUPREP BOWEL PREP KIT 17.5-3.13-1.6 GM/177ML PO SOLN: 1.0000 | Freq: Once | ORAL | 0 refills | Status: AC

## 2019-05-20 NOTE — Progress Notes (Signed)
No egg or soy allergy known to patient  No issues with past sedation with any surgeries  or procedures, no intubation problems  No diet pills per patient No home 02 use per patient  No blood thinners per patient  Pt denies issues with constipation  No A fib or A flutter  EMMI video sent to pt's e mail  Sup[employer] Coupon $15   Due to the COVID-19 pandemic we are asking patients to follow these guidelines. Please only bring one care partner. Please be aware that your care partner may wait in the car in the parking lot or if they feel like they will be too hot to wait in the car, they may wait in the lobby on the 4th floor. All care partners are required to wear a mask the entire time (we do not have any that we can provide them), they need to practice social distancing, and we will do a Covid check for all patient's and care partners when you arrive. Also we will check their temperature and your temperature. If the care partner waits in their car they need to stay in the parking lot the entire time and we will call them on their cell phone when the patient is ready for discharge so they can bring the car to the front of the building. Also all patient's will need to wear a mask into building.

## 2019-05-25 ENCOUNTER — Other Ambulatory Visit: Payer: Self-pay | Admitting: Neurosurgery

## 2019-05-25 DIAGNOSIS — M5416 Radiculopathy, lumbar region: Secondary | ICD-10-CM

## 2019-05-29 ENCOUNTER — Ambulatory Visit (INDEPENDENT_AMBULATORY_CARE_PROVIDER_SITE_OTHER): Payer: BC Managed Care – PPO

## 2019-05-29 ENCOUNTER — Other Ambulatory Visit: Payer: Self-pay | Admitting: Gastroenterology

## 2019-05-29 DIAGNOSIS — Z1159 Encounter for screening for other viral diseases: Secondary | ICD-10-CM | POA: Diagnosis not present

## 2019-05-30 ENCOUNTER — Encounter: Payer: Self-pay | Admitting: Gastroenterology

## 2019-05-30 LAB — SARS CORONAVIRUS 2 (TAT 6-24 HRS): SARS Coronavirus 2: NEGATIVE

## 2019-06-03 ENCOUNTER — Encounter: Payer: Self-pay | Admitting: Gastroenterology

## 2019-06-03 ENCOUNTER — Ambulatory Visit (AMBULATORY_SURGERY_CENTER): Payer: BC Managed Care – PPO | Admitting: Gastroenterology

## 2019-06-03 ENCOUNTER — Other Ambulatory Visit: Payer: Self-pay

## 2019-06-03 VITALS — BP 124/73 | HR 87 | Temp 97.1°F | Resp 20 | Ht 63.5 in | Wt 267.0 lb

## 2019-06-03 DIAGNOSIS — Z1211 Encounter for screening for malignant neoplasm of colon: Secondary | ICD-10-CM

## 2019-06-03 MED ORDER — SODIUM CHLORIDE 0.9 % IV SOLN: 500.0000 mL | Freq: Once | INTRAVENOUS | Status: DC

## 2019-06-03 NOTE — Progress Notes (Signed)
Pt's states no medical or surgical changes since previsit or office visit.  Temp JB VS DT  

## 2019-06-03 NOTE — Patient Instructions (Signed)
YOU HAD AN ENDOSCOPIC PROCEDURE TODAY AT THE [facility]:   Refer to the procedure report that was given to you for any specific questions about what was found during the examination.  If the procedure report does not answer your questions, please call your gastroenterologist to clarify.  If you requested that your care partner not be given the details of your procedure findings, then the procedure report has been included in a sealed envelope for you to review at your convenience later.  YOU SHOULD EXPECT: Some feelings of bloating in the abdomen. Passage of more gas than usual.  Walking can help get rid of the air that was put into your GI tract during the procedure and reduce the bloating. If you had a lower endoscopy (such as a colonoscopy or flexible sigmoidoscopy) you may notice spotting of blood in your stool or on the toilet paper. If you underwent a bowel prep for your procedure, you may not have a normal bowel movement for a few days.  Please Note:  You might notice some irritation and congestion in your nose or some drainage.  This is from the oxygen used during your procedure.  There is no need for concern and it should clear up in a day or so.  SYMPTOMS TO REPORT IMMEDIATELY:   Following lower endoscopy (colonoscopy or flexible sigmoidoscopy):  Excessive amounts of blood in the stool  Significant tenderness or worsening of abdominal pains  Swelling of the abdomen that is new, acute  Fever of 100F or higher   For urgent or emergent issues, a gastroenterologist can be reached at any hour by calling [phone].   DIET:  We do recommend a small meal at first, but then you may proceed to your regular diet.  Drink plenty of fluids but you should avoid alcoholic beverages for 24 hours.  MEDICATIONS: Continue present medications.  Please see handouts given to you by your recovery nurse.  ACTIVITY:  You should plan to take it easy for the rest of today and you should  NOT DRIVE or use heavy machinery until tomorrow (because of the sedation medicines used during the test).    FOLLOW UP: Our staff will call the number listed on your records 48-72 hours following your procedure to check on you and address any questions or concerns that you may have regarding the information given to you following your procedure. If we do not reach you, we will leave a message.  We will attempt to reach you two times.  During this call, we will ask if you have developed any symptoms of COVID 19. If you develop any symptoms (ie: fever, flu-like symptoms, shortness of breath, cough etc.) before then, please call [phone].  If you test positive for Covid 19 in the 2 weeks post procedure, please call and report this information to us.    If any biopsies were taken you will be contacted by phone or by letter within the next 1-3 weeks.  Please call us at [phone] if you have not heard about the biopsies in 3 weeks.   Thank you for allowing us to provide for your healthcare needs today.   SIGNATURES/CONFIDENTIALITY: You and/or your care partner have signed paperwork which will be entered into your electronic medical record.  These signatures attest to the fact that that the information above on your After Visit Summary has been reviewed and is understood.  Full responsibility of the confidentiality of this discharge information lies with you and/or   your care-partner. 

## 2019-06-03 NOTE — Op Note (Signed)
[facility] Patient Name: Kayla Combs Procedure Date: 06/03/2019 8:54 AM MRN: [id number] Endoscopist: Napoleon Form , MD Age: 49 Referring MD:  Date of Birth: 02/15/1971 Gender: Female Account #: [id number] Procedure:                Colonoscopy Indications:              Screening for colorectal malignant neoplasm Medicines:                Monitored Anesthesia Care Procedure:                Pre-Anesthesia Assessment:                           - Prior to the procedure, a History and Physical                            was performed, and patient medications and                            allergies were reviewed. The patient's tolerance of                            previous anesthesia was also reviewed. The risks                            and benefits of the procedure and the sedation                            options and risks were discussed with the patient.                            All questions were answered, and informed consent                            was obtained. Prior Anticoagulants: The patient has                            taken no previous anticoagulant or antiplatelet                            agents. ASA Grade Assessment: II - A patient with                            mild systemic disease. After reviewing the risks                            and benefits, the patient was deemed in                            satisfactory condition to undergo the procedure.                           After obtaining informed consent, the colonoscope  was passed under direct vision. Throughout the                            procedure, the patient's blood pressure, pulse, and                            oxygen saturations were monitored continuously. The                            Colonoscope was introduced through the anus and                            advanced to the the cecum, identified by                            appendiceal  orifice and ileocecal valve. The                            colonoscopy was performed without difficulty. The                            patient tolerated the procedure well. The quality                            of the bowel preparation was excellent. The                            ileocecal valve, appendiceal orifice, and rectum                            were photographed. Scope In: 9:05:32 AM Scope Out: 9:20:06 AM Scope Withdrawal Time: 0 hours 10 minutes 58 seconds  Total Procedure Duration: 0 hours 14 minutes 34 seconds  Findings:                 The perianal and digital rectal examinations were                            normal.                           A few small-mouthed diverticula were found in the                            sigmoid colon and descending colon.                           Non-bleeding internal hemorrhoids were found during                            retroflexion. The hemorrhoids were small.                           The exam was otherwise without abnormality. Complications:            No immediate complications. Estimated Blood Loss:  Estimated blood loss was minimal. Impression:               - Diverticulosis in the sigmoid colon and in the                            descending colon.                           - Non-bleeding internal hemorrhoids.                           - The examination was otherwise normal.                           - No specimens collected. Recommendation:           - Patient has a contact number available for                            emergencies. The signs and symptoms of potential                            delayed complications were discussed with the                            patient. Return to normal activities tomorrow.                            Written discharge instructions were provided to the                            patient.                           - Resume previous diet.                           - Continue  present medications.                           - Repeat colonoscopy in 10 years for screening                            purposes. Napoleon Form, MD 06/03/2019 9:24:08 AM This report has been signed electronically.

## 2019-06-03 NOTE — Progress Notes (Signed)
A and O x3. Report to RN. Tolerated MAC anesthesia well.

## 2019-06-05 ENCOUNTER — Telehealth: Payer: Self-pay

## 2019-06-05 NOTE — Telephone Encounter (Signed)
  Follow up Call-  Call back number 06/03/2019  Post procedure Call Back phone  # [phone]  (husband's cell phone)  Permission to leave phone message Yes  Some recent data might be hidden     Patient questions:  Do you have a fever, pain , or abdominal swelling? No. Pain Score  0 *  Have you tolerated food without any problems? Yes.    Have you been able to return to your normal activities? Yes.    Do you have any questions about your discharge instructions: Diet   No. Medications  No. Follow up visit  No.  Do you have questions or concerns about your Care? No.  Actions: * If pain score is 4 or above: No action needed, pain <4.  1. Have you developed a fever since your procedure? no  2.   Have you had an respiratory symptoms (SOB or cough) since your procedure? no  3.   Have you tested positive for COVID 19 since your procedure no  4.   Have you had any family members/close contacts diagnosed with the COVID 19 since your procedure?  no   If yes to any of these questions please route to Laverna Peace, RN and Jennye Boroughs, Charity fundraiser.

## 2019-06-12 ENCOUNTER — Ambulatory Visit
Admission: RE | Admit: 2019-06-12 | Discharge: 2019-06-12 | Disposition: A | Discharge status: Home / Self Care | Payer: BC Managed Care – PPO | Source: Ambulatory Visit | Attending: Neurosurgery | Admitting: Neurosurgery

## 2019-06-12 DIAGNOSIS — M5416 Radiculopathy, lumbar region: Secondary | ICD-10-CM

## 2019-06-12 DIAGNOSIS — M48061 Spinal stenosis, lumbar region without neurogenic claudication: Secondary | ICD-10-CM | POA: Diagnosis not present

## 2019-06-15 DIAGNOSIS — Z1239 Encounter for other screening for malignant neoplasm of breast: Secondary | ICD-10-CM | POA: Diagnosis not present

## 2019-06-15 DIAGNOSIS — Z1231 Encounter for screening mammogram for malignant neoplasm of breast: Secondary | ICD-10-CM | POA: Diagnosis not present

## 2019-06-17 LAB — HM MAMMOGRAPHY

## 2019-06-18 ENCOUNTER — Encounter: Payer: Self-pay | Admitting: General Practice

## 2019-06-18 ENCOUNTER — Other Ambulatory Visit: Payer: BC Managed Care – PPO

## 2019-06-26 DIAGNOSIS — M5126 Other intervertebral disc displacement, lumbar region: Secondary | ICD-10-CM | POA: Insufficient documentation

## 2019-06-26 DIAGNOSIS — M545 Low back pain: Secondary | ICD-10-CM | POA: Diagnosis not present

## 2019-06-26 DIAGNOSIS — M48061 Spinal stenosis, lumbar region without neurogenic claudication: Secondary | ICD-10-CM | POA: Insufficient documentation

## 2019-06-26 DIAGNOSIS — M47816 Spondylosis without myelopathy or radiculopathy, lumbar region: Secondary | ICD-10-CM | POA: Diagnosis not present

## 2019-06-26 DIAGNOSIS — M5416 Radiculopathy, lumbar region: Secondary | ICD-10-CM | POA: Diagnosis not present

## 2019-07-17 ENCOUNTER — Other Ambulatory Visit: Payer: Self-pay | Admitting: Emergency Medicine

## 2019-07-17 ENCOUNTER — Other Ambulatory Visit: Payer: Self-pay | Admitting: Family Medicine

## 2019-07-17 MED ORDER — METOPROLOL TARTRATE 25 MG PO TABS: 50.0000 mg | ORAL_TABLET | Freq: Two times a day (BID) | ORAL | 1 refills | Status: DC

## 2019-08-12 DIAGNOSIS — M48061 Spinal stenosis, lumbar region without neurogenic claudication: Secondary | ICD-10-CM | POA: Diagnosis not present

## 2019-10-03 ENCOUNTER — Other Ambulatory Visit: Payer: Self-pay | Admitting: General Practice

## 2019-10-03 MED ORDER — FLUTICASONE PROPIONATE 50 MCG/ACT NA SUSP: 1.0000 | Freq: Every day | NASAL | 1 refills | Status: DC

## 2020-01-25 ENCOUNTER — Other Ambulatory Visit: Payer: Self-pay | Admitting: Family Medicine

## 2020-01-25 MED ORDER — METOPROLOL TARTRATE 25 MG PO TABS: 50.0000 mg | ORAL_TABLET | Freq: Two times a day (BID) | ORAL | 0 refills | Status: DC

## 2020-01-25 NOTE — Progress Notes (Signed)
Received call from [facility] patient has to travel to [place] unexpectedly to see her mother who is in the hospital. She needs an emergency supply of her Metoprolol to carry with her so that is sent to her local pharmacy as a 60 day supply

## 2020-01-27 ENCOUNTER — Telehealth: Payer: Self-pay | Admitting: General Practice

## 2020-01-27 NOTE — Telephone Encounter (Signed)
Error. meds filled this weekend by on call provider.

## 2020-02-25 ENCOUNTER — Other Ambulatory Visit: Payer: Self-pay

## 2020-02-25 DIAGNOSIS — I10 Essential (primary) hypertension: Secondary | ICD-10-CM

## 2020-02-25 DIAGNOSIS — F418 Other specified anxiety disorders: Secondary | ICD-10-CM

## 2020-02-25 MED ORDER — METOPROLOL TARTRATE 25 MG PO TABS: 50.0000 mg | ORAL_TABLET | Freq: Two times a day (BID) | ORAL | 0 refills | Status: DC

## 2020-02-25 MED ORDER — CITALOPRAM HYDROBROMIDE 40 MG PO TABS: 40.0000 mg | ORAL_TABLET | Freq: Every day | ORAL | 3 refills | Status: DC

## 2020-04-14 ENCOUNTER — Other Ambulatory Visit: Payer: Self-pay | Admitting: Family Medicine

## 2020-04-14 DIAGNOSIS — I10 Essential (primary) hypertension: Secondary | ICD-10-CM

## 2020-04-15 ENCOUNTER — Encounter: Payer: Self-pay | Admitting: Family Medicine

## 2020-04-15 DIAGNOSIS — I10 Essential (primary) hypertension: Secondary | ICD-10-CM

## 2020-04-15 MED ORDER — METOPROLOL TARTRATE 25 MG PO TABS: 50.0000 mg | ORAL_TABLET | Freq: Two times a day (BID) | ORAL | 0 refills | Status: DC

## 2020-05-17 ENCOUNTER — Encounter: Payer: Self-pay | Admitting: Family Medicine

## 2020-05-18 ENCOUNTER — Other Ambulatory Visit: Payer: Self-pay

## 2020-05-18 DIAGNOSIS — I10 Essential (primary) hypertension: Secondary | ICD-10-CM

## 2020-05-18 MED ORDER — METOPROLOL TARTRATE 25 MG PO TABS: 50.0000 mg | ORAL_TABLET | Freq: Two times a day (BID) | ORAL | 0 refills | Status: DC

## 2020-05-27 ENCOUNTER — Encounter: Payer: BC Managed Care – PPO | Admitting: Family Medicine

## 2020-06-05 HISTORY — PX: DILITATION & CURRETTAGE/HYSTROSCOPY WITH HYDROTHERMAL ABLATION: SHX5570

## 2020-06-08 ENCOUNTER — Other Ambulatory Visit: Payer: Self-pay

## 2020-06-08 ENCOUNTER — Encounter (HOSPITAL_BASED_OUTPATIENT_CLINIC_OR_DEPARTMENT_OTHER): Payer: Self-pay | Admitting: Obstetrics & Gynecology

## 2020-06-08 NOTE — Progress Notes (Signed)
Spoke w/ via phone for pre-op interview---pt Lab needs dos---urin preg          Lab results------echo 01-08-2015 epic-lab appt 06-09-2020 at 1130 am for cbc bmp t & s ekg           COVID test ------06-09-2020 at 1030 am Arrive at -------530 am 06-11-2020 NPO after MN NO Solid Food.  Clear liquids from MN until---430 am then npo Medications to take morning of surgery -----metorprolol tartrate, flonase alprazolam prn Diabetic medication -----n/a Patient Special Instructions -----none Pre-Op special Istructions -----none Patient verbalized understanding of instructions that were given at this phone interview. Patient denies shortness of breath, chest pain, fever, cough at this phone interview.

## 2020-06-09 ENCOUNTER — Encounter (HOSPITAL_COMMUNITY)
Admission: RE | Admit: 2020-06-09 | Discharge: 2020-06-09 | Disposition: A | Discharge status: Home / Self Care | Payer: BC Managed Care – PPO | Source: Ambulatory Visit | Attending: Obstetrics & Gynecology | Admitting: Obstetrics & Gynecology

## 2020-06-09 ENCOUNTER — Other Ambulatory Visit (HOSPITAL_COMMUNITY): Payer: BC Managed Care – PPO

## 2020-06-09 ENCOUNTER — Other Ambulatory Visit (HOSPITAL_COMMUNITY)
Admission: RE | Admit: 2020-06-09 | Discharge: 2020-06-09 | Disposition: A | Discharge status: Home / Self Care | Payer: BC Managed Care – PPO | Source: Ambulatory Visit | Attending: Obstetrics & Gynecology | Admitting: Obstetrics & Gynecology

## 2020-06-09 DIAGNOSIS — I10 Essential (primary) hypertension: Secondary | ICD-10-CM | POA: Insufficient documentation

## 2020-06-09 DIAGNOSIS — Z01818 Encounter for other preprocedural examination: Secondary | ICD-10-CM | POA: Insufficient documentation

## 2020-06-09 DIAGNOSIS — N841 Polyp of cervix uteri: Secondary | ICD-10-CM | POA: Diagnosis not present

## 2020-06-09 DIAGNOSIS — Z20822 Contact with and (suspected) exposure to covid-19: Secondary | ICD-10-CM | POA: Insufficient documentation

## 2020-06-09 DIAGNOSIS — Z87891 Personal history of nicotine dependence: Secondary | ICD-10-CM | POA: Diagnosis not present

## 2020-06-09 DIAGNOSIS — Z886 Allergy status to analgesic agent status: Secondary | ICD-10-CM | POA: Diagnosis not present

## 2020-06-09 DIAGNOSIS — N920 Excessive and frequent menstruation with regular cycle: Secondary | ICD-10-CM | POA: Diagnosis present

## 2020-06-09 LAB — CBC
HCT: 42.6 % (ref 36.0–46.0)
Hemoglobin: 14.3 g/dL (ref 12.0–15.0)
MCH: 28.7 pg (ref 26.0–34.0)
MCHC: 33.6 g/dL (ref 30.0–36.0)
MCV: 85.5 fL (ref 80.0–100.0)
Platelets: 412 10*3/uL — ABNORMAL HIGH (ref 150–400)
RBC: 4.98 MIL/uL (ref 3.87–5.11)
RDW: 13 % (ref 11.5–15.5)
WBC: 11.6 10*3/uL — ABNORMAL HIGH (ref 4.0–10.5)
nRBC: 0 % (ref 0.0–0.2)

## 2020-06-09 LAB — BASIC METABOLIC PANEL
Anion gap: 8 (ref 5–15)
BUN: 15 mg/dL (ref 6–20)
CO2: 27 mmol/L (ref 22–32)
Calcium: 9.5 mg/dL (ref 8.9–10.3)
Chloride: 103 mmol/L (ref 98–111)
Creatinine, Ser: 0.59 mg/dL (ref 0.44–1.00)
GFR, Estimated: >60 mL/min (ref >60)
Glucose, Bld: 111 mg/dL — ABNORMAL HIGH (ref 70–99)
Potassium: 4.4 mmol/L (ref 3.5–5.1)
Sodium: 138 mmol/L (ref 135–145)

## 2020-06-09 LAB — SARS CORONAVIRUS 2 (TAT 6-24 HRS): SARS Coronavirus 2: NEGATIVE

## 2020-06-09 NOTE — H&P (Signed)
Kayla Combs is an 50 y.o. female G3P3 with regular, heavy menses.  She desires surgical management and declines hormonal tx.  No VMS.  Partner s/p vasectomy  Pertinent Gynecological History: Menses: flow is excessive with use of 8 pads or tampons on heaviest days Bleeding: Regular Contraception: vasectomy DES exposure: unknown Blood transfusions: none Sexually transmitted diseases: no past history Previous GYN Procedures: C/S x 3  Last mammogram: normal Date: 06/15/19 Last pap: normal Date: 05/27/20 OB History: G3, P3   Menstrual History: Menarche age: n/a Patient's last menstrual period was 06/03/2020.    Past Medical History:  Diagnosis Date  . Allergy   . Anxiety   . Arthritis    lower back   . Depression   . History of asthma as young adult  . Hyperlipidemia    diet controlled  . Hypertension   . Migraine headache   . PONV (postoperative nausea and vomiting)   . Wears glasses     Past Surgical History:  Procedure Laterality Date  . CESAREAN SECTION     x 3   . NAILBED REPAIR Right 10/16/2018   Procedure: BIOPSY RIGHT NAILBED MIDDLE FINGER;  Surgeon: Cindee Salt, MD;  Location: [facility];  Service: Orthopedics;  Laterality: Right;  IV REGIONAL FOREARM BLOCK  . TONSILLECTOMY AND ADENOIDECTOMY  as child age 48  . WISDOM TOOTH EXTRACTION     1986-1987    Family History  Problem Relation Age of Onset  . Cervical cancer Mother   . Colon cancer Neg Hx   . Colon polyps Neg Hx   . Esophageal cancer Neg Hx   . Rectal cancer Neg Hx   . Stomach cancer Neg Hx     Social History:  reports that she quit smoking about 9 years ago. Her smoking use included cigarettes. She has a 3.00 pack-year smoking history. She has never used smokeless tobacco. She reports current alcohol use. She reports that she does not use drugs.  Allergies:  Allergies  Allergen Reactions  . Aleve [Naproxen Sodium] Itching    No medications prior to admission.     Review of Systems  Height 5' 3.5" (1.613 m), weight 115.2 kg, last menstrual period 06/03/2020. Physical Exam Constitutional:      Appearance: Normal appearance.  HENT:     Head: Normocephalic and atraumatic.  Pulmonary:     Effort: Pulmonary effort is normal.  Abdominal:     Palpations: Abdomen is soft.  Musculoskeletal:        General: Normal range of motion.     Cervical back: Normal range of motion.  Skin:    General: Skin is warm and dry.  Neurological:     Mental Status: She is alert and oriented to person, place, and time.  Psychiatric:        Mood and Affect: Mood normal.        Behavior: Behavior normal.     Results for orders placed or performed during the hospital encounter of 06/09/20 (from the past 24 hour(s))  Type and screen  SURGERY CENTER     Status: None   Collection Time: 06/09/20 11:12 AM  Result Value Ref Range   ABO/RH(D) O POS    Antibody Screen NEG    Sample Expiration 06/23/2020,2359    Extend sample reason      NO TRANSFUSIONS OR PREGNANCY IN THE PAST 3 MONTHS Performed at [facility], [address], [place], [place] [address]   CBC  Status: Abnormal   Collection Time: 06/09/20 11:12 AM  Result Value Ref Range   WBC 11.6 (H) 4.0 - 10.5 K/uL   RBC 4.98 3.87 - 5.11 MIL/uL   Hemoglobin 14.3 12.0 - 15.0 g/dL   HCT 79.0 24.0 - 97.3 %   MCV 85.5 80.0 - 100.0 fL   MCH 28.7 26.0 - 34.0 pg   MCHC 33.6 30.0 - 36.0 g/dL   RDW 53.2 99.2 - 42.6 %   Platelets 412 (H) 150 - 400 K/uL   nRBC 0.0 0.0 - 0.2 %  Basic metabolic panel per protocol     Status: Abnormal   Collection Time: 06/09/20 11:12 AM  Result Value Ref Range   Sodium 138 135 - 145 mmol/L   Potassium 4.4 3.5 - 5.1 mmol/L   Chloride 103 98 - 111 mmol/L   CO2 27 22 - 32 mmol/L   Glucose, Bld 111 (H) 70 - 99 mg/dL   BUN 15 6 - 20 mg/dL   Creatinine, Ser 8.34 0.44 - 1.00 mg/dL   Calcium 9.5 8.9 - 19.6 mg/dL   GFR, Estimated >22 >29 mL/min   Anion  gap 8 5 - 15    No results found.  Assessment/Plan: 50yo G3P3 with menorrhagia -H/S, D&C, HTA -Patient is counseled re: risk of bleeding, infection, scarring and damage to surrounding structures.  She is informed of the possibility of failure of HTA in which case hysterectomy would be the next step.  She is informed of possible inaccessability of endometrial cavity following ablation.  All questions were answered and patient wishes to proceed.    Mitchel Honour 06/09/2020, 2:44 PM

## 2020-06-10 NOTE — Anesthesia Preprocedure Evaluation (Addendum)
Anesthesia Evaluation  Patient identified by MRN, date of birth, ID band Patient awake    Reviewed: Allergy & Precautions, NPO status , Patient's Chart, lab work & pertinent test results, reviewed documented beta blocker date and time   History of Anesthesia Complications (+) PONV and history of anesthetic complications (has not had GA as adult; N/V with c section)  Airway Mallampati: III  TM Distance: >3 FB Neck ROM: Full    Dental  (+) Teeth Intact, Dental Advisory Given,    Pulmonary former smoker,  Quit smoking 2012, 3 pack year history    Pulmonary exam normal breath sounds clear to auscultation       Cardiovascular hypertension, Pt. on home beta blockers Normal cardiovascular exam Rhythm:Regular Rate:Normal     Neuro/Psych  Headaches, PSYCHIATRIC DISORDERS Anxiety Depression    GI/Hepatic negative GI ROS, Neg liver ROS,   Endo/Other  Morbid obesityBMI 44  Renal/GU negative Renal ROS  Female GU complaint (menorrhagia )     Musculoskeletal  (+) Arthritis , Osteoarthritis,  chronic LBP   Abdominal (+) + obese,   Peds  Hematology negative hematology ROS (+) hct 42.6   Anesthesia Other Findings   Reproductive/Obstetrics negative OB ROS                            Anesthesia Physical Anesthesia Plan  ASA: III  Anesthesia Plan: General   Post-op Pain Management:    Induction: Intravenous  PONV Risk Score and Plan: 4 or greater and Ondansetron, Dexamethasone, Midazolam, Scopolamine patch - Pre-op, Treatment may vary due to age or medical condition and Propofol infusion  Airway Management Planned: LMA  Additional Equipment: None  Intra-op Plan:   Post-operative Plan: Extubation in OR  Informed Consent: I have reviewed the patients History and Physical, chart, labs and discussed the procedure including the risks, benefits and alternatives for the proposed anesthesia with the  patient or authorized representative who has indicated his/her understanding and acceptance.     Dental advisory given  Plan Discussed with: CRNA  Anesthesia Plan Comments:        Anesthesia Quick Evaluation

## 2020-06-11 ENCOUNTER — Other Ambulatory Visit: Payer: Self-pay

## 2020-06-11 ENCOUNTER — Ambulatory Visit (HOSPITAL_BASED_OUTPATIENT_CLINIC_OR_DEPARTMENT_OTHER): Payer: BC Managed Care – PPO | Admitting: Certified Registered"

## 2020-06-11 ENCOUNTER — Encounter (HOSPITAL_BASED_OUTPATIENT_CLINIC_OR_DEPARTMENT_OTHER): Payer: Self-pay | Admitting: Obstetrics & Gynecology

## 2020-06-11 ENCOUNTER — Ambulatory Visit (HOSPITAL_BASED_OUTPATIENT_CLINIC_OR_DEPARTMENT_OTHER)
Admission: RE | Admit: 2020-06-11 | Discharge: 2020-06-11 | Disposition: A | Discharge status: Home / Self Care | Payer: BC Managed Care – PPO | Attending: Obstetrics & Gynecology | Admitting: Obstetrics & Gynecology

## 2020-06-11 ENCOUNTER — Encounter (HOSPITAL_BASED_OUTPATIENT_CLINIC_OR_DEPARTMENT_OTHER)
Admission: RE | Disposition: A | Discharge status: Home / Self Care | Payer: Self-pay | Source: Home / Self Care | Attending: Obstetrics & Gynecology

## 2020-06-11 DIAGNOSIS — Z87891 Personal history of nicotine dependence: Secondary | ICD-10-CM | POA: Insufficient documentation

## 2020-06-11 DIAGNOSIS — N841 Polyp of cervix uteri: Secondary | ICD-10-CM | POA: Insufficient documentation

## 2020-06-11 DIAGNOSIS — N920 Excessive and frequent menstruation with regular cycle: Secondary | ICD-10-CM | POA: Diagnosis not present

## 2020-06-11 DIAGNOSIS — I10 Essential (primary) hypertension: Secondary | ICD-10-CM | POA: Insufficient documentation

## 2020-06-11 DIAGNOSIS — Z886 Allergy status to analgesic agent status: Secondary | ICD-10-CM | POA: Insufficient documentation

## 2020-06-11 DIAGNOSIS — Z20822 Contact with and (suspected) exposure to covid-19: Secondary | ICD-10-CM | POA: Insufficient documentation

## 2020-06-11 HISTORY — PX: DILITATION & CURRETTAGE/HYSTROSCOPY WITH HYDROTHERMAL ABLATION: SHX5570

## 2020-06-11 HISTORY — DX: Personal history of other diseases of the respiratory system: Z87.09

## 2020-06-11 HISTORY — DX: Presence of spectacles and contact lenses: Z97.3

## 2020-06-11 LAB — ABO/RH: ABO/RH(D): O POS

## 2020-06-11 LAB — TYPE AND SCREEN
ABO/RH(D): O POS
Antibody Screen: NEGATIVE

## 2020-06-11 LAB — POCT PREGNANCY, URINE: Preg Test, Ur: NEGATIVE

## 2020-06-11 SURGERY — DILATATION & CURETTAGE/HYSTEROSCOPY WITH HYDROTHERMAL ABLATION
Anesthesia: General

## 2020-06-11 MED ORDER — OXYCODONE-ACETAMINOPHEN 5-325 MG PO TABS: 1.0000 | ORAL_TABLET | ORAL | 0 refills | Status: DC | PRN

## 2020-06-11 MED ORDER — DEXAMETHASONE SODIUM PHOSPHATE 10 MG/ML IJ SOLN
INTRAMUSCULAR | Status: DC | PRN
  Administered 2020-06-11: 10 mg via INTRAVENOUS

## 2020-06-11 MED ORDER — PROMETHAZINE HCL 25 MG/ML IJ SOLN
6.2500 mg | INTRAMUSCULAR | Status: DC | PRN
  Administered 2020-06-11: 6.25 mg via INTRAVENOUS

## 2020-06-11 MED ORDER — LACTATED RINGERS IV SOLN: INTRAVENOUS | Status: DC

## 2020-06-11 MED ORDER — HYDROMORPHONE HCL 1 MG/ML IJ SOLN
INTRAMUSCULAR | Status: AC
  Filled 2020-06-11: qty 1

## 2020-06-11 MED ORDER — ACETAMINOPHEN 500 MG PO TABS
ORAL_TABLET | ORAL | Status: AC
  Filled 2020-06-11: qty 2

## 2020-06-11 MED ORDER — PROMETHAZINE HCL 25 MG/ML IJ SOLN
INTRAMUSCULAR | Status: AC
  Filled 2020-06-11: qty 1

## 2020-06-11 MED ORDER — POVIDONE-IODINE 10 % EX SWAB: 2.0000 "application " | Freq: Once | CUTANEOUS | Status: DC

## 2020-06-11 MED ORDER — PROPOFOL 10 MG/ML IV BOLUS
INTRAVENOUS | Status: AC
  Filled 2020-06-11: qty 40

## 2020-06-11 MED ORDER — DIPHENHYDRAMINE HCL 50 MG/ML IJ SOLN
INTRAMUSCULAR | Status: DC | PRN
  Administered 2020-06-11: 12.5 mg via INTRAVENOUS

## 2020-06-11 MED ORDER — SCOPOLAMINE 1 MG/3DAYS TD PT72
MEDICATED_PATCH | TRANSDERMAL | Status: AC
  Filled 2020-06-11: qty 1

## 2020-06-11 MED ORDER — MIDAZOLAM HCL 5 MG/5ML IJ SOLN
INTRAMUSCULAR | Status: DC | PRN
  Administered 2020-06-11: 2 mg via INTRAVENOUS

## 2020-06-11 MED ORDER — SCOPOLAMINE 1 MG/3DAYS TD PT72
1.0000 | MEDICATED_PATCH | TRANSDERMAL | Status: DC
  Administered 2020-06-11: 1.5 mg via TRANSDERMAL

## 2020-06-11 MED ORDER — OXYCODONE HCL 5 MG PO TABS
5.0000 mg | ORAL_TABLET | Freq: Once | ORAL | Status: AC | PRN
  Administered 2020-06-11: 5 mg via ORAL

## 2020-06-11 MED ORDER — HYDROMORPHONE HCL 1 MG/ML IJ SOLN
0.2500 mg | INTRAMUSCULAR | Status: DC | PRN
  Administered 2020-06-11 (×3): 0.5 mg via INTRAVENOUS

## 2020-06-11 MED ORDER — FENTANYL CITRATE (PF) 100 MCG/2ML IJ SOLN
INTRAMUSCULAR | Status: DC | PRN
  Administered 2020-06-11 (×2): 50 ug via INTRAVENOUS

## 2020-06-11 MED ORDER — PROPOFOL 10 MG/ML IV BOLUS
INTRAVENOUS | Status: AC
  Filled 2020-06-11: qty 20

## 2020-06-11 MED ORDER — LIDOCAINE 2% (20 MG/ML) 5 ML SYRINGE
INTRAMUSCULAR | Status: DC | PRN
  Administered 2020-06-11: 80 mg via INTRAVENOUS

## 2020-06-11 MED ORDER — AMISULPRIDE (ANTIEMETIC) 5 MG/2ML IV SOLN
INTRAVENOUS | Status: AC
  Filled 2020-06-11: qty 4

## 2020-06-11 MED ORDER — LIDOCAINE HCL (PF) 2 % IJ SOLN
INTRAMUSCULAR | Status: AC
  Filled 2020-06-11: qty 5

## 2020-06-11 MED ORDER — IBUPROFEN 800 MG PO TABS: 800.0000 mg | ORAL_TABLET | Freq: Four times a day (QID) | ORAL | 0 refills | Status: DC | PRN

## 2020-06-11 MED ORDER — DIPHENHYDRAMINE HCL 50 MG/ML IJ SOLN
INTRAMUSCULAR | Status: AC
  Filled 2020-06-11: qty 1

## 2020-06-11 MED ORDER — ONDANSETRON HCL 4 MG/2ML IJ SOLN
INTRAMUSCULAR | Status: AC
  Filled 2020-06-11: qty 2

## 2020-06-11 MED ORDER — PROPOFOL 10 MG/ML IV BOLUS
INTRAVENOUS | Status: DC | PRN
  Administered 2020-06-11: 200 mg via INTRAVENOUS

## 2020-06-11 MED ORDER — DEXAMETHASONE SODIUM PHOSPHATE 10 MG/ML IJ SOLN
INTRAMUSCULAR | Status: AC
  Filled 2020-06-11: qty 1

## 2020-06-11 MED ORDER — MIDAZOLAM HCL 2 MG/2ML IJ SOLN
INTRAMUSCULAR | Status: AC
  Filled 2020-06-11: qty 2

## 2020-06-11 MED ORDER — OXYCODONE HCL 5 MG/5ML PO SOLN: 5.0000 mg | Freq: Once | ORAL | Status: AC | PRN

## 2020-06-11 MED ORDER — DEXMEDETOMIDINE (PRECEDEX) IN NS 20 MCG/5ML (4 MCG/ML) IV SYRINGE
PREFILLED_SYRINGE | INTRAVENOUS | Status: AC
  Filled 2020-06-11: qty 5

## 2020-06-11 MED ORDER — KETOROLAC TROMETHAMINE 30 MG/ML IJ SOLN: 30.0000 mg | Freq: Once | INTRAMUSCULAR | Status: DC | PRN

## 2020-06-11 MED ORDER — MEPERIDINE HCL 25 MG/ML IJ SOLN
6.2500 mg | INTRAMUSCULAR | Status: DC | PRN
Start: 2020-06-11 — End: 2020-06-11

## 2020-06-11 MED ORDER — KETOROLAC TROMETHAMINE 30 MG/ML IJ SOLN
INTRAMUSCULAR | Status: DC | PRN
  Administered 2020-06-11: 30 mg via INTRAVENOUS

## 2020-06-11 MED ORDER — PROPOFOL 500 MG/50ML IV EMUL
INTRAVENOUS | Status: DC | PRN
  Administered 2020-06-11: 150 ug/kg/min via INTRAVENOUS

## 2020-06-11 MED ORDER — ACETAMINOPHEN 500 MG PO TABS
1000.0000 mg | ORAL_TABLET | Freq: Once | ORAL | Status: AC
  Administered 2020-06-11: 1000 mg via ORAL

## 2020-06-11 MED ORDER — ONDANSETRON HCL 4 MG/2ML IJ SOLN
INTRAMUSCULAR | Status: AC
  Filled 2020-06-11: qty 4

## 2020-06-11 MED ORDER — DEXMEDETOMIDINE (PRECEDEX) IN NS 20 MCG/5ML (4 MCG/ML) IV SYRINGE
PREFILLED_SYRINGE | INTRAVENOUS | Status: DC | PRN
  Administered 2020-06-11 (×2): 4 ug via INTRAVENOUS
  Administered 2020-06-11: 8 ug via INTRAVENOUS
  Administered 2020-06-11: 4 ug via INTRAVENOUS

## 2020-06-11 MED ORDER — FENTANYL CITRATE (PF) 100 MCG/2ML IJ SOLN
INTRAMUSCULAR | Status: AC
  Filled 2020-06-11: qty 2

## 2020-06-11 MED ORDER — LIDOCAINE HCL 1 % IJ SOLN
INTRAMUSCULAR | Status: DC | PRN
  Administered 2020-06-11: 10 mL

## 2020-06-11 MED ORDER — ONDANSETRON HCL 4 MG/2ML IJ SOLN
INTRAMUSCULAR | Status: DC | PRN
  Administered 2020-06-11 (×2): 4 mg via INTRAVENOUS

## 2020-06-11 MED ORDER — ONDANSETRON HCL 4 MG/2ML IJ SOLN
4.0000 mg | Freq: Once | INTRAMUSCULAR | Status: AC
  Administered 2020-06-11: 4 mg via INTRAVENOUS

## 2020-06-11 MED ORDER — AMISULPRIDE (ANTIEMETIC) 5 MG/2ML IV SOLN
10.0000 mg | Freq: Once | INTRAVENOUS | Status: AC
  Administered 2020-06-11: 10 mg via INTRAVENOUS

## 2020-06-11 MED ORDER — SODIUM CHLORIDE 0.9 % IR SOLN
Status: DC | PRN
  Administered 2020-06-11: 3000 mL

## 2020-06-11 MED ORDER — KETOROLAC TROMETHAMINE 30 MG/ML IJ SOLN
INTRAMUSCULAR | Status: AC
  Filled 2020-06-11: qty 1

## 2020-06-11 MED ORDER — OXYCODONE HCL 5 MG PO TABS
ORAL_TABLET | ORAL | Status: AC
  Filled 2020-06-11: qty 1

## 2020-06-11 SURGICAL SUPPLY — 19 items
BIPOLAR CUTTING LOOP 21FR (ELECTRODE)
CATH ROBINSON RED A/P 16FR (CATHETERS) ×2 IMPLANT
COVER WAND RF STERILE (DRAPES) ×2 IMPLANT
DILATOR CANAL MILEX (MISCELLANEOUS) IMPLANT
DRAPE HYSTEROSCOPY (MISCELLANEOUS) ×2 IMPLANT
ELECT REM PT RETURN 9FT ADLT (ELECTROSURGICAL)
ELECTRODE REM PT RTRN 9FT ADLT (ELECTROSURGICAL) IMPLANT
GLOVE BIOGEL PI IND STRL 6 (GLOVE) ×2 IMPLANT
GLOVE BIOGEL PI INDICATOR 6 (GLOVE) ×2
GLOVE SURG ENC MOIS LTX SZ6 (GLOVE) ×2 IMPLANT
GOWN STRL REUS W/TWL LRG LVL3 (GOWN DISPOSABLE) ×2 IMPLANT
KIT PROCEDURE FLUENT (KITS) IMPLANT
KIT TURNOVER CYSTO (KITS) ×2 IMPLANT
LOOP CUTTING BIPOLAR 21FR (ELECTRODE) IMPLANT
PACK VAGINAL MINOR WOMEN LF (CUSTOM PROCEDURE TRAY) ×2 IMPLANT
SET GENESYS HTA PROCERVA (MISCELLANEOUS) ×2 IMPLANT
SYR BULB EAR ULCER 2OZ BL STRL (SYRINGE) ×2 IMPLANT
TOWEL OR 17X26 10 PK STRL BLUE (TOWEL DISPOSABLE) ×2 IMPLANT
WATER STERILE IRR 500ML POUR (IV SOLUTION) ×2 IMPLANT

## 2020-06-11 NOTE — Transfer of Care (Signed)
Immediate Anesthesia Transfer of Care Note  Patient: Prisma McDonough-Hughes  Procedure(s) Performed: DILATATION & CURETTAGE/HYSTEROSCOPY WITH HYDROTHERMAL ABLATION, CERVICAL BIOPSY (N/A )  Patient Location: PACU  Anesthesia Type:General  Level of Consciousness: drowsy  Airway & Oxygen Therapy: Patient Spontanous Breathing and Patient connected to face mask oxygen  Post-op Assessment: Report given to RN and Post -op Vital signs reviewed and stable  Post vital signs: Reviewed and stable  Last Vitals:  Vitals Value Taken Time  BP 111/76 06/11/20 0815  Temp 36.4 C 06/11/20 0810  Pulse 75 06/11/20 0819  Resp 14 06/11/20 0819  SpO2 100 % 06/11/20 0819  Vitals shown include unvalidated device data.  Last Pain:  Vitals:   06/11/20 0810  TempSrc:   PainSc: Asleep      Patients Stated Pain Goal: 2 (06/11/20 0604)  Complications: No complications documented.

## 2020-06-11 NOTE — Op Note (Signed)
PREOPERATIVE DIAGNOSIS:  50 y.o. with regular, heavy menstrual bleeding  POSTOPERATIVE DIAGNOSIS: The same plus cervical polyp  PROCEDURE: Hysteroscopy, Dilation and Curettage, cervical polypectomy, HTA.  SURGEON:  Dr. Mitchel Honour  INDICATIONS: 50 y.o. here for scheduled surgery for management of heavy menstrual bleeding.   Risks of surgery were discussed with the patient including but not limited to: bleeding which may require transfusion; infection which may require antibiotics; injury to uterus or surrounding organs; intrauterine scarring which may impair future fertility; need for additional procedures including laparotomy or laparoscopy; and other postoperative/anesthesia complications. Written informed consent was obtained.    FINDINGS:  Cervical polyp.  A 7 week size uterus.  Scant endometrium.  Normal ostia bilaterally.  ANESTHESIA:   General, paracervical block  ESTIMATED BLOOD LOSS:  Less than 20 ml  SPECIMENS: Endometrial curettings and cervical polyp sent to pathology  COMPLICATIONS:  None immediate.  PROCEDURE DETAILS:  The patient was taken to the operating room where general anesthesia was administered and was found to be adequate.  After an adequate timeout was performed, she was placed in the dorsal lithotomy position and examined; then prepped and draped in the sterile manner.  A speculum was then placed in the patient's vagina and a single tooth tenaculum was applied to the anterior lip of the cervix.   A paracervical block using 10 ml of 1% lidocaine was administered.  Cervical polyp was noted and removed using forceps and twisting motion.  This was sent to pathology.  The uteus was sounded to 9 cm and dilated manually with metal dilators to 23 [place].  Once the cervix was dilated, the hysteroscope was inserted under direct visualization using saline as a suspension medium.  The uterine cavity was carefully examined, both ostia were recognized.   After further careful  visualization of the uterine cavity, the hysteroscope was removed under direct visualization.  A sharp curettage was then performed to obtain a small amount of endometrial curettings which were sent to pathology.  The hysteroscope with HTA sheath was reintroduced.  The tip of the sheath was positioned just within the internal os allowing global visualization.  The test cycle was performed and there was no leakage of fluid.  The treatment cycle was performed for the full 10 minutes.  Following the cool down cycle, the hysteroscope was removed.  The tenaculum was removed from the anterior lip of the cervix and the vaginal speculum was removed after noting good hemostasis.  The patient tolerated the procedure well and was taken to the recovery area awake, extubated and in stable condition.

## 2020-06-11 NOTE — Anesthesia Postprocedure Evaluation (Signed)
Anesthesia Post Note  Patient: Kayla Combs  Procedure(s) Performed: DILATATION & CURETTAGE/HYSTEROSCOPY WITH HYDROTHERMAL ABLATION, CERVICAL BIOPSY (N/A )     Patient location during evaluation: PACU Anesthesia Type: General Level of consciousness: awake and alert, oriented and patient cooperative Pain management: pain level controlled Vital Signs Assessment: post-procedure vital signs reviewed and stable Respiratory status: spontaneous breathing, nonlabored ventilation and respiratory function stable Cardiovascular status: blood pressure returned to baseline and stable Postop Assessment: no apparent nausea or vomiting Anesthetic complications: yes (PONV) Comments: Recommend TIVA next anesthetic and multimodal antiemetic   No complications documented.  Last Vitals:  Vitals:   06/11/20 1015 06/11/20 1149  BP: 117/81 133/81  Pulse: 85 92  Resp: 12 14  Temp:  36.7 C  SpO2: 94% 100%    Last Pain:  Vitals:   06/11/20 1149  TempSrc:   PainSc: 3                  Lannie Fields

## 2020-06-11 NOTE — Progress Notes (Signed)
No change to H&P.  Kayla Minahan, DO 

## 2020-06-11 NOTE — Anesthesia Procedure Notes (Signed)
Procedure Name: LMA Insertion Date/Time: 06/11/2020 7:18 AM Performed by: Marny Lowenstein, CRNA Pre-anesthesia Checklist: Patient identified, Emergency Drugs available, Suction available and Patient being monitored Patient Re-evaluated:Patient Re-evaluated prior to induction Oxygen Delivery Method: Circle system utilized Preoxygenation: Pre-oxygenation with 100% oxygen Induction Type: IV induction LMA: LMA inserted LMA Size: 4.0 Number of attempts: 1 Airway Equipment and Method: Patient positioned with wedge pillow Placement Confirmation: positive ETCO2 and breath sounds checked- equal and bilateral Tube secured with: Tape Dental Injury: Teeth and Oropharynx as per pre-operative assessment

## 2020-06-11 NOTE — Discharge Instructions (Signed)

## 2020-06-12 ENCOUNTER — Encounter (HOSPITAL_BASED_OUTPATIENT_CLINIC_OR_DEPARTMENT_OTHER): Payer: Self-pay | Admitting: Obstetrics & Gynecology

## 2020-06-12 LAB — SURGICAL PATHOLOGY

## 2020-06-14 ENCOUNTER — Encounter: Payer: Self-pay | Admitting: Family Medicine

## 2020-06-14 DIAGNOSIS — I10 Essential (primary) hypertension: Secondary | ICD-10-CM

## 2020-06-15 MED ORDER — METOPROLOL TARTRATE 25 MG PO TABS: 50.0000 mg | ORAL_TABLET | Freq: Two times a day (BID) | ORAL | 0 refills | Status: DC

## 2020-07-07 ENCOUNTER — Encounter: Payer: Self-pay | Admitting: Family Medicine

## 2020-07-07 ENCOUNTER — Other Ambulatory Visit: Payer: Self-pay

## 2020-07-07 ENCOUNTER — Ambulatory Visit (INDEPENDENT_AMBULATORY_CARE_PROVIDER_SITE_OTHER): Payer: BC Managed Care – PPO | Admitting: Family Medicine

## 2020-07-07 VITALS — BP 122/80 | HR 80 | Temp 98.5°F | Resp 19 | Ht 64.0 in | Wt 251.6 lb

## 2020-07-07 DIAGNOSIS — I10 Essential (primary) hypertension: Secondary | ICD-10-CM | POA: Diagnosis not present

## 2020-07-07 DIAGNOSIS — Z0000 Encounter for general adult medical examination without abnormal findings: Secondary | ICD-10-CM | POA: Diagnosis not present

## 2020-07-07 DIAGNOSIS — L719 Rosacea, unspecified: Secondary | ICD-10-CM

## 2020-07-07 DIAGNOSIS — E559 Vitamin D deficiency, unspecified: Secondary | ICD-10-CM | POA: Diagnosis not present

## 2020-07-07 LAB — HM MAMMOGRAPHY

## 2020-07-07 LAB — LIPID PANEL
Cholesterol: 176 mg/dL (ref 0–200)
HDL: 36.8 mg/dL — ABNORMAL LOW (ref >39.00)
NonHDL: 139.26
Total CHOL/HDL Ratio: 5
Triglycerides: 202 mg/dL — ABNORMAL HIGH (ref 0.0–149.0)
VLDL: 40.4 mg/dL — ABNORMAL HIGH (ref 0.0–40.0)

## 2020-07-07 LAB — HEPATIC FUNCTION PANEL
ALT: 24 U/L (ref 0–35)
AST: 21 U/L (ref 0–37)
Albumin: 4.2 g/dL (ref 3.5–5.2)
Alkaline Phosphatase: 70 U/L (ref 39–117)
Bilirubin, Direct: 0.1 mg/dL (ref 0.0–0.3)
Total Bilirubin: 0.6 mg/dL (ref 0.2–1.2)
Total Protein: 7.3 g/dL (ref 6.0–8.3)

## 2020-07-07 LAB — VITAMIN D 25 HYDROXY (VIT D DEFICIENCY, FRACTURES): VITD: 34.7 ng/mL (ref 30.00–100.00)

## 2020-07-07 LAB — LDL CHOLESTEROL, DIRECT: Direct LDL: 104 mg/dL

## 2020-07-07 LAB — TSH: TSH: 1.65 u[IU]/mL (ref 0.35–4.50)

## 2020-07-07 MED ORDER — METOPROLOL TARTRATE 25 MG PO TABS: 50.0000 mg | ORAL_TABLET | Freq: Two times a day (BID) | ORAL | 1 refills | Status: DC

## 2020-07-07 MED ORDER — FLUTICASONE PROPIONATE 50 MCG/ACT NA SUSP: 1.0000 | Freq: Every day | NASAL | 1 refills | Status: DC

## 2020-07-07 MED ORDER — ZOLPIDEM TARTRATE 10 MG PO TABS: 10.0000 mg | ORAL_TABLET | Freq: Every evening | ORAL | 1 refills | Status: DC | PRN

## 2020-07-07 MED ORDER — ALPRAZOLAM 0.25 MG PO TABS: 0.2500 mg | ORAL_TABLET | Freq: Every day | ORAL | 0 refills | Status: DC | PRN

## 2020-07-07 NOTE — Patient Instructions (Addendum)
Follow up in 6 months to recheck BP and weight loss progress We'll notify you of your lab results and make any changes if needed Continue to work on healthy diet and regular exercise- you're doing great!!! Call with any questions or concerns Stay Safe!  Stay Healthy! Happy Spring!!!

## 2020-07-07 NOTE — Assessment & Plan Note (Signed)
Check labs and replete prn. 

## 2020-07-07 NOTE — Assessment & Plan Note (Signed)
Pt's PE WNL w/ exception of obesity.  UTD on pap, mammo (today), colonoscopy, immunizations.  Check labs.  Anticipatory guidance provided.

## 2020-07-07 NOTE — Assessment & Plan Note (Signed)
Chronic problem.  Well controlled today.  Currently asymptomatic.  Refill on Metoprolol sent.

## 2020-07-07 NOTE — Progress Notes (Signed)
   Subjective:    Patient ID: Kayla Combs, female    DOB: 1970-11-22, 50 y.o.   MRN: [id number]  HPI CPE- UTD on colonoscopy, mammo, pap, Tdap, COVID, flu.    Reviewed past medical, surgical, family and social histories.   Patient Care Team    Relationship Specialty Notifications Start End  Sheliah Hatch, MD PCP - General   04/07/10   Lynnea Ferrier., MD Referring Physician Obstetrics and Gynecology  04/03/14     Health Maintenance  Topic Date Due  . COVID-19 Vaccine (2 - Pfizer 3-dose series) 05/27/2019  . Hepatitis C Screening  07/07/2021 (Originally 10-Feb-1971)  . HIV Screening  07/07/2021 (Originally 03/23/1984)  . MAMMOGRAM  07/07/2021  . PAP SMEAR-Modifier  05/28/2023  . TETANUS/TDAP  09/12/2023  . COLONOSCOPY (Pts 45-39yrs Insurance coverage will need to be confirmed)  06/02/2029  . INFLUENZA VACCINE  Completed  . HPV VACCINES  Aged Out      Review of Systems Patient reports no vision/ hearing changes, adenopathy,fever, weight change,  persistant/recurrent hoarseness , swallowing issues, chest pain, palpitations, edema, persistant/recurrent cough, hemoptysis, dyspnea (rest/exertional/paroxysmal nocturnal), gastrointestinal bleeding (melena, rectal bleeding), abdominal pain, significant heartburn, bowel changes, GU symptoms (dysuria, hematuria, incontinence), Gyn symptoms (abnormal  bleeding, pain),  syncope, focal weakness, memory loss, numbness & tingling, skin/hair/nail changes, abnormal bruising or bleeding, anxiety, or depression.   This visit occurred during the SARS-CoV-2 public health emergency.  Safety protocols were in place, including screening questions prior to the visit, additional usage of staff PPE, and extensive cleaning of exam room while observing appropriate contact time as indicated for disinfecting solutions.       Objective:   Physical Exam General Appearance:    Alert, cooperative, no distress, appears stated age  Head:     Normocephalic, without obvious abnormality, atraumatic  Eyes:    PERRL, conjunctiva/corneas clear, EOM's intact, fundi    benign, both eyes  Ears:    Normal TM's and external ear canals, both ears  Nose:   Deferred due to COVID  Throat:   Neck:   Supple, symmetrical, trachea midline, no adenopathy;    Thyroid: no enlargement/tenderness/nodules  Back:     Symmetric, no curvature, ROM normal, no CVA tenderness  Lungs:     Clear to auscultation bilaterally, respirations unlabored  Chest Wall:    No tenderness or deformity   Heart:    Regular rate and rhythm, S1 and S2 normal, no murmur, rub   or gallop  Breast Exam:    Deferred to GYN  Abdomen:     Soft, non-tender, bowel sounds active all four quadrants,    no masses, no organomegaly  Genitalia:    Deferred to GYN  Rectal:    Extremities:   Extremities normal, atraumatic, no cyanosis or edema  Pulses:   2+ and symmetric all extremities  Skin:   Skin color, texture, turgor normal, no rashes or lesions  Lymph nodes:   Cervical, supraclavicular, and axillary nodes normal  Neurologic:   CNII-XII intact, normal strength, sensation and reflexes    throughout          Assessment & Plan:

## 2020-07-09 ENCOUNTER — Encounter: Payer: Self-pay | Admitting: Family Medicine

## 2020-07-09 MED ORDER — ALPRAZOLAM 0.25 MG PO TABS: 0.2500 mg | ORAL_TABLET | Freq: Every day | ORAL | 0 refills | Status: DC | PRN

## 2020-07-10 MED ORDER — ZOLPIDEM TARTRATE 10 MG PO TABS: 10.0000 mg | ORAL_TABLET | Freq: Every evening | ORAL | 0 refills | Status: AC | PRN

## 2020-09-14 ENCOUNTER — Other Ambulatory Visit: Payer: Self-pay

## 2020-09-14 DIAGNOSIS — I10 Essential (primary) hypertension: Secondary | ICD-10-CM

## 2020-09-14 MED ORDER — METOPROLOL TARTRATE 25 MG PO TABS: 50.0000 mg | ORAL_TABLET | Freq: Two times a day (BID) | ORAL | 0 refills | Status: DC

## 2020-10-21 ENCOUNTER — Encounter: Payer: Self-pay | Admitting: *Deleted

## 2020-12-13 ENCOUNTER — Encounter: Payer: Self-pay | Admitting: Family Medicine

## 2020-12-14 ENCOUNTER — Other Ambulatory Visit: Payer: Self-pay

## 2020-12-14 DIAGNOSIS — I10 Essential (primary) hypertension: Secondary | ICD-10-CM

## 2020-12-14 MED ORDER — ALPRAZOLAM 0.25 MG PO TABS: 0.2500 mg | ORAL_TABLET | Freq: Every day | ORAL | 0 refills | Status: AC | PRN

## 2020-12-14 MED ORDER — METOPROLOL TARTRATE 25 MG PO TABS: 50.0000 mg | ORAL_TABLET | Freq: Two times a day (BID) | ORAL | 0 refills | Status: DC

## 2020-12-14 NOTE — Telephone Encounter (Signed)
Hi!  I hope your son is recovering well. As per our conversation during my physical, I have been monitoring my BP at home in lieu of a follow up visit due to my work schedule. I need my metoprolol and Xanax refilled please. Please note that these need to be sent to [facility].    Blood pressure:  April 132/78 HR 90  July 115/67 HR 86  August 145/82 HR 91    LFD 07/09/20 #90 with no refills LOV 07/07/20 NOV none

## 2021-03-22 ENCOUNTER — Other Ambulatory Visit: Payer: Self-pay | Admitting: Family Medicine

## 2021-03-22 DIAGNOSIS — I10 Essential (primary) hypertension: Secondary | ICD-10-CM

## 2021-03-23 MED ORDER — METOPROLOL TARTRATE 25 MG PO TABS: 50.0000 mg | ORAL_TABLET | Freq: Two times a day (BID) | ORAL | 0 refills | Status: DC

## 2021-05-31 ENCOUNTER — Other Ambulatory Visit: Payer: Self-pay

## 2021-05-31 ENCOUNTER — Ambulatory Visit (INDEPENDENT_AMBULATORY_CARE_PROVIDER_SITE_OTHER): Payer: BC Managed Care – PPO | Admitting: Family Medicine

## 2021-05-31 ENCOUNTER — Encounter: Payer: Self-pay | Admitting: Family Medicine

## 2021-05-31 VITALS — BP 146/80 | HR 105 | Temp 97.7°F | Ht 64.0 in | Wt 252.6 lb

## 2021-05-31 DIAGNOSIS — I493 Ventricular premature depolarization: Secondary | ICD-10-CM | POA: Diagnosis not present

## 2021-05-31 NOTE — Progress Notes (Signed)
[facility] [address] [place] [address] Dept: [phone] Dept Fax: [phone]  Office Visit  Subjective:    Patient ID: Kayla Combs, female    DOB: [birth year], 51 y.o..   MRN: [id number]  Chief Complaint  Patient presents with   Acute Visit    C/o irregular HR x 3 days.      History of Present Illness:  Patient is in today complaining of a 1-week history of an irregular heart beat. She notes the irregular beats were more infrequent at first, but now occurring about every 5 or 6 beats. He Apple Watch showed what she thinks are PVCs.  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 She has a past history of tachycardia from about 8 years ago. This was apparently exacerbated by stress. She is currently on metoprolol for hypertension. She recently went on GOLO for weight loss, but stopped this several days ago, worried this may be worsening the dysrhythmia.  Past Medical History: Patient Active Problem List   Diagnosis Date Noted   Degenerative lumbar spinal stenosis 06/26/2019   Disc displacement, lumbar 06/26/2019   Low back pain 05/13/2019   Radiculopathy, lumbar region 05/13/2019   Vitamin D deficiency 04/19/2018   Thyroid nodule 01/21/2015   Hypocalcemia 01/21/2015   Needs sleep apnea assessment 01/08/2015   Pap smear for cervical cancer screening 04/03/2014   Cervical polyp 04/03/2014   Severe obesity (BMI >= 40) ([place]) 04/02/2013   Insomnia 11/27/2012   Tachycardia 04/30/2012   Routine general medical examination at a health care facility 03/06/2012   Hypertriglyceridemia 03/06/2012   HTN (hypertension) 03/06/2012   Depression with anxiety 03/06/2012   Sciatica of left side 12/17/2008   Past Surgical History:  Procedure Laterality Date   CESAREAN SECTION     x 3    DILITATION & CURRETTAGE/HYSTROSCOPY WITH HYDROTHERMAL ABLATION N/A 06/11/2020   Procedure: DILATATION & CURETTAGE/HYSTEROSCOPY WITH HYDROTHERMAL  ABLATION, CERVICAL BIOPSY;  Surgeon: Linda Hedges, DO;  Location: [facility];  Service: Gynecology;  Laterality: N/A;   DILITATION & CURRETTAGE/HYSTROSCOPY WITH HYDROTHERMAL ABLATION  06/05/2020   NAILBED REPAIR Right 10/16/2018   Procedure: BIOPSY RIGHT NAILBED MIDDLE FINGER;  Surgeon: Daryll Brod, MD;  Location: [facility];  Service: Orthopedics;  Laterality: Right;  IV REGIONAL FOREARM BLOCK   TONSILLECTOMY AND ADENOIDECTOMY  as child age 60   WISDOM TOOTH EXTRACTION     1986-1987   Family History  Problem Relation Age of Onset   Cervical cancer Mother    Colon cancer Neg Hx    Colon polyps Neg Hx    Esophageal cancer Neg Hx    Rectal cancer Neg Hx    Stomach cancer Neg Hx    Outpatient Medications Prior to Visit  Medication Sig Dispense Refill   ALPRAZolam (XANAX) 0.25 MG tablet Take 1 tablet (0.25 mg total) by mouth daily as needed for anxiety. 90 tablet 0   B Complex Vitamins (VITAMIN B COMPLEX) TABS Take by mouth daily. Stress complex b vitamin     Cholecalciferol (VITAMIN D) 2000 UNITS tablet Take 2,000 Units by mouth daily.     fluticasone (FLONASE) 50 MCG/ACT nasal spray Place 1 spray into both nostrils daily. 48 g 1   ibuprofen (ADVIL) 800 MG tablet Take 1 tablet (800 mg total) by mouth every 6 (six) hours as needed. 30 tablet 0   metoprolol tartrate (LOPRESSOR) 25 MG tablet Take 2 tablets (50 mg total) by mouth 2 (two) times daily.  360 tablet 0   Multiple Vitamins-Minerals (CENTRUM SILVER PO) Take by mouth.     zolpidem (AMBIEN) 10 MG tablet Take 1 tablet (10 mg total) by mouth at bedtime as needed for sleep. 90 tablet 0   metroNIDAZOLE (METROCREAM) 0.75 % cream Apply 1 a small amount to affected area once a day     COVID-19 Specimen Collection KIT See admin instructions. for testing     oxyCODONE-acetaminophen (PERCOCET) 5-325 MG tablet Take 1 tablet by mouth every 4 (four) hours as needed for severe pain. 15 tablet 0   No  facility-administered medications prior to visit.   Allergies  Allergen Reactions   Aleve [Naproxen Sodium] Itching     Objective:   Today's Vitals   05/31/21 1611  BP: (!) 146/80  Pulse: (!) 105  Temp: 97.7 F (36.5 C)  TempSrc: Temporal  SpO2: 98%  Weight: 252 lb 9.6 oz (114.6 kg)  Height: '5\' 4"'  (1.626 m)   Body mass index is 43.36 kg/m.   General: Well developed, well nourished. No acute distress. CV: RRR without murmurs or rubs. There is a pause about every 6th beat. Pulses 2+   bilaterally, again with palpable pause. Psych: Alert and oriented. Normal mood and affect.  Health Maintenance Due  Topic Date Due   Zoster Vaccines- Shingrix (1 of 2) Never done     EKG: Sinus tachycardia  (rate= 106). Unifocal PVCs occurring every 6th beat. Possible left atrial enlargement (wide, notched p-wave in II, negative p-wave in V1). Assessment & Plan:   1. Frequent PVCs I will check some screening labs. I recommend Kayla Combs avoid stimulants. I agree with her having stopped GOLO for now, though I don't see a stimulant listed as an ingredient. I will refer her to cardiology for assessment. She may need an echocardiogram to assess further.  - EKG 12-Lead - TSH - T4, free - CBC - Comprehensive metabolic panel - Ambulatory referral to Cardiology  Haydee Salter, MD

## 2021-06-01 ENCOUNTER — Encounter: Payer: Self-pay | Admitting: Family Medicine

## 2021-06-01 LAB — CBC
HCT: 44.3 % (ref 36.0–46.0)
Hemoglobin: 15 g/dL (ref 12.0–15.0)
MCHC: 33.9 g/dL (ref 30.0–36.0)
MCV: 84.6 fl (ref 78.0–100.0)
Platelets: 403 10*3/uL — ABNORMAL HIGH (ref 150.0–400.0)
RBC: 5.24 Mil/uL — ABNORMAL HIGH (ref 3.87–5.11)
RDW: 12.9 % (ref 11.5–15.5)
WBC: 16.6 10*3/uL — ABNORMAL HIGH (ref 4.0–10.5)

## 2021-06-01 LAB — COMPREHENSIVE METABOLIC PANEL
ALT: 31 U/L (ref 0–35)
AST: 24 U/L (ref 0–37)
Albumin: 4.8 g/dL (ref 3.5–5.2)
Alkaline Phosphatase: 64 U/L (ref 39–117)
BUN: 14 mg/dL (ref 6–23)
CO2: 29 mEq/L (ref 19–32)
Calcium: 10.4 mg/dL (ref 8.4–10.5)
Chloride: 101 mEq/L (ref 96–112)
Creatinine, Ser: 0.68 mg/dL (ref 0.40–1.20)
GFR: 100.29 mL/min (ref >60.00)
Glucose, Bld: 89 mg/dL (ref 70–99)
Potassium: 3.6 mEq/L (ref 3.5–5.1)
Sodium: 139 mEq/L (ref 135–145)
Total Bilirubin: 0.8 mg/dL (ref 0.2–1.2)
Total Protein: 7.9 g/dL (ref 6.0–8.3)

## 2021-06-01 LAB — TSH: TSH: 1.96 u[IU]/mL (ref 0.35–5.50)

## 2021-06-01 LAB — T4, FREE: Free T4: 1.04 ng/dL (ref 0.60–1.60)

## 2021-06-01 NOTE — Telephone Encounter (Signed)
Spoke to patient, she states that she is having some SOB that is making her worried.  She did get a call and is scheduled for cardiology on 06/03/21 @3 :20 pm.  They also placed her on a call list if they have any cancellations.  She is aware to go to be evaluated if she starts feeling worse and agrees to do that.   She did ask if she is okay to be working? Please review and advise.  Thanks. Dm/cma

## 2021-06-03 ENCOUNTER — Encounter: Payer: Self-pay | Admitting: Cardiology

## 2021-06-03 ENCOUNTER — Other Ambulatory Visit: Payer: Self-pay

## 2021-06-03 ENCOUNTER — Ambulatory Visit (INDEPENDENT_AMBULATORY_CARE_PROVIDER_SITE_OTHER): Payer: BC Managed Care – PPO

## 2021-06-03 ENCOUNTER — Ambulatory Visit (INDEPENDENT_AMBULATORY_CARE_PROVIDER_SITE_OTHER): Payer: BC Managed Care – PPO | Admitting: Cardiology

## 2021-06-03 VITALS — BP 144/86 | HR 95 | Ht 63.5 in | Wt 253.2 lb

## 2021-06-03 DIAGNOSIS — I493 Ventricular premature depolarization: Secondary | ICD-10-CM

## 2021-06-03 DIAGNOSIS — R400 Somnolence: Secondary | ICD-10-CM

## 2021-06-03 DIAGNOSIS — R0602 Shortness of breath: Secondary | ICD-10-CM | POA: Diagnosis not present

## 2021-06-03 DIAGNOSIS — R7303 Prediabetes: Secondary | ICD-10-CM | POA: Diagnosis not present

## 2021-06-03 DIAGNOSIS — R5383 Other fatigue: Secondary | ICD-10-CM

## 2021-06-03 NOTE — Progress Notes (Unsigned)
[id number] 14 day zio xt from office inventory applied to patient.

## 2021-06-03 NOTE — Patient Instructions (Addendum)
Medication Instructions:  Your physician has recommended you make the following change in your medication:  STOP: Metoprolol tartrate (Lopressor) 25 mg tonight and tomorrow morning. START: Cardizem 180 mg (Start noon tomorrow)  *If you need a refill on your cardiac medications before your next appointment, please call your pharmacy*   Lab Work: Your physician recommends that you return for lab work in:  TODAY: HgbA1c If you have labs (blood work) drawn today and your tests are completely normal, you will receive your results only by: [place] (if you have [place]) OR A paper copy in the mail If you have any lab test that is abnormal or we need to change your treatment, we will call you to review the results.   Testing/Procedures: Your physician has requested that you have an echocardiogram. Echocardiography is a painless test that uses sound waves to create images of your heart. It provides your doctor with information about the size and shape of your heart and how well your hearts chambers and valves are working. This procedure takes approximately one hour. There are no restrictions for this procedure.  ZIO XT- Long Term Monitor Instructions  Your physician has requested you wear a ZIO patch monitor for 14 days.  This is a single patch monitor. [employer] supplies one patch monitor per enrollment. Additional stickers are not available. Please do not apply patch if you will be having a Nuclear Stress Test,  Echocardiogram, Cardiac CT, MRI, or Chest Xray during the period you would be wearing the  monitor. The patch cannot be worn during these tests. You cannot remove and re-apply the  ZIO XT patch monitor.  Your ZIO patch monitor will be mailed 3 day USPS to your address on file. It may take 3-5 days  to receive your monitor after you have been enrolled.  Once you have received your monitor, please review the enclosed instructions. Your monitor  has already been registered  assigning a specific monitor serial # to you.  Billing and Patient Assistance Program Information  We have supplied [employer] with any of your insurance information on file for billing purposes. [employer] offers a sliding scale Patient Assistance Program for patients that do not have  insurance, or whose insurance does not completely cover the cost of the ZIO monitor.  You must apply for the Patient Assistance Program to qualify for this discounted rate.  To apply, please call [employer] at [phone], select option 4, select option 2, ask to apply for  Patient Assistance Program. Theodore Demark will ask your household income, and how many people  are in your household. They will quote your out-of-pocket cost based on that information.  [employer] will also be able to set up a 83-month, interest-free payment plan if needed.  Applying the monitor   Shave hair from upper left chest.  Hold abrader disc by orange tab. Rub abrader in 40 strokes over the upper left chest as  indicated in your monitor instructions.  Clean area with 4 enclosed alcohol pads. Let dry.  Apply patch as indicated in monitor instructions. Patch will be placed under collarbone on left  side of chest with arrow pointing upward.  Rub patch adhesive wings for 2 minutes. Remove white label marked "1". Remove the white  label marked "2". Rub patch adhesive wings for 2 additional minutes.  While looking in a mirror, press and release button in center of patch. A small green light will  flash 3-4 times. This will be your only indicator that the monitor  has been turned on.  Do not shower for the first 24 hours. You may shower after the first 24 hours.  Press the button if you feel a symptom. You will hear a small click. Record Date, Time and  Symptom in the Patient Logbook.  When you are ready to remove the patch, follow instructions on the last 2 pages of Patient  Logbook. Stick patch monitor onto the last page of Patient Logbook.  Place  Patient Logbook in the blue and white box. Use locking tab on box and tape box closed  securely. The blue and white box has prepaid postage on it. Please place it in the mailbox as  soon as possible. Your physician should have your test results approximately 7 days after the  monitor has been mailed back to [facility].  Call [place] at [phone] if you have questions regarding  your ZIO XT patch monitor. Call them immediately if you see an orange light blinking on your  monitor.  If your monitor falls off in less than 4 days, contact our Monitor department at [phone].  If your monitor becomes loose or falls off after 4 days call [employer] at [phone] for  suggestions on securing your monitor    Follow-Up: At [facility], you and your health needs are our priority.  As part of our continuing mission to provide you with exceptional heart care, we have created designated Provider Care Teams.  These Care Teams include your primary Cardiologist (physician) and Advanced Practice Providers (APPs -  Physician Assistants and Nurse Practitioners) who all work together to provide you with the care you need, when you need it.  We recommend signing up for the patient portal called "MyChart".  Sign up information is provided on this After Visit Summary.  MyChart is used to connect with patients for Virtual Visits (Telemedicine).  Patients are able to view lab/test results, encounter notes, upcoming appointments, etc.  Non-urgent messages can be sent to your provider as well.   To learn more about what you can do with MyChart, go to [email or web address].    Your next appointment:   12 week(s)  The format for your next appointment:   In Person  Provider:   Berniece Salines, DO     Other Instructions

## 2021-06-03 NOTE — Progress Notes (Signed)
Cardiology Office Note:    Date:  06/03/2021   ID:  Kayla LaityJodi Combs, DOB 09/14/1970, MRN [id number]  PCP:  Sheliah Hatchabori, Katherine E, MD  Cardiologist:  Thomasene RippleKardie Lavine Hargrove, DO  Electrophysiologist:  None   Referring MD: Loyola Mastudd, Stephen M, MD   " I am having lots of palpitations"   History of Present Illness:    Kayla BunJodi Combs is a 51 y.o. female with a hx of hypertension, anxiety, hyperlipidemia, obesity who is here today to be evaluated for increased palpitations.  Patient tells me that she has had issues with high heart rate in the past she has had echocardiograms which is normal.  But recently she took the [facility] supplement and noticed that her palpitation has increased tremendously.  Is happening more when she gets ready to fall asleep.  Is happening during the day.  She is concerned about this.  She also does have some shortness of breath that is associated with this.  No chest pain.  She admits to some fatigue as well as daytime somnolence.  Past Medical History:  Diagnosis Date   Allergy    Anxiety    Arthritis    lower back    Depression    History of asthma as young adult   Hyperlipidemia    diet controlled   Hypertension    Migraine headache    PONV (postoperative nausea and vomiting)    Wears glasses     Past Surgical History:  Procedure Laterality Date   CESAREAN SECTION     x 3    DILITATION & CURRETTAGE/HYSTROSCOPY WITH HYDROTHERMAL ABLATION N/A 06/11/2020   Procedure: DILATATION & CURETTAGE/HYSTEROSCOPY WITH HYDROTHERMAL ABLATION, CERVICAL BIOPSY;  Surgeon: Mitchel HonourMorris, Megan, DO;  Location: [facility];  Service: Gynecology;  Laterality: N/A;   DILITATION & CURRETTAGE/HYSTROSCOPY WITH HYDROTHERMAL ABLATION  06/05/2020   NAILBED REPAIR Right 10/16/2018   Procedure: BIOPSY RIGHT NAILBED MIDDLE FINGER;  Surgeon: Cindee SaltKuzma, Gary, MD;  Location: [facility];  Service: Orthopedics;  Laterality: Right;  IV REGIONAL FOREARM BLOCK   TONSILLECTOMY AND  ADENOIDECTOMY  as child age 51   WISDOM TOOTH EXTRACTION     1986-1987    Current Medications: Current Meds  Medication Sig   ALPRAZolam (XANAX) 0.25 MG tablet Take 1 tablet (0.25 mg total) by mouth daily as needed for anxiety.   Cholecalciferol (VITAMIN D) 2000 UNITS tablet Take 2,000 Units by mouth daily.   fluticasone (FLONASE) 50 MCG/ACT nasal spray Place 1 spray into both nostrils daily. (Patient taking differently: Place 1 spray into both nostrils as needed.)   ibuprofen (ADVIL) 800 MG tablet Take 1 tablet (800 mg total) by mouth every 6 (six) hours as needed.   metoprolol tartrate (LOPRESSOR) 25 MG tablet Take 2 tablets (50 mg total) by mouth 2 (two) times daily.   metroNIDAZOLE (METROCREAM) 0.75 % cream Apply 1 a small amount to affected area once a day   Multiple Vitamins-Minerals (CENTRUM SILVER PO) Take by mouth.   zolpidem (AMBIEN) 10 MG tablet Take 1 tablet (10 mg total) by mouth at bedtime as needed for sleep.     Allergies:   Aleve [naproxen sodium]   Social History   Socioeconomic History   Marital status: Married    Spouse name: Not on file   Number of children: 3   Years of education: Not on file   Highest education level: Not on file  Occupational History   Occupation: RN  Tobacco Use   Smoking status: Former  Packs/day: 0.30    Years: 10.00    Pack years: 3.00    Types: Cigarettes    Quit date: 03/26/2011    Years since quitting: 10.1   Smokeless tobacco: Never  Vaping Use   Vaping Use: Never used  Substance and Sexual Activity   Alcohol use: Yes    Alcohol/week: 0.0 - 4.0 standard drinks    Comment: social   Drug use: No   Sexual activity: Not on file    Comment: husband vasectomy  Other Topics Concern   Not on file  Social History Narrative   Not on file   Social Determinants of Health   Financial Resource Strain: Not on file  Food Insecurity: Not on file  Transportation Needs: Not on file  Physical Activity: Not on file  Stress: Not  on file  Social Connections: Not on file     Family History: The patient's family history includes Cervical cancer in her mother. There is no history of Colon cancer, Colon polyps, Esophageal cancer, Rectal cancer, or Stomach cancer.  ROS:   Review of Systems  Constitution: Negative for decreased appetite, fever and weight gain.  HENT: Negative for congestion, ear discharge, hoarse voice and sore throat.   Eyes: Negative for discharge, redness, vision loss in right eye and visual halos.  Cardiovascular: Report palpitations and dyspnea exertion.  Negative for chest pain, leg swelling, orthopnea. Respiratory: Negative for cough, hemoptysis, shortness of breath and snoring.   Endocrine: Negative for heat intolerance and polyphagia.  Hematologic/Lymphatic: Negative for bleeding problem. Does not bruise/bleed easily.  Skin: Negative for flushing, nail changes, rash and suspicious lesions.  Musculoskeletal: Negative for arthritis, joint pain, muscle cramps, myalgias, neck pain and stiffness.  Gastrointestinal: Negative for abdominal pain, bowel incontinence, diarrhea and excessive appetite.  Genitourinary: Negative for decreased libido, genital sores and incomplete emptying.  Neurological: Negative for brief paralysis, focal weakness, headaches and loss of balance.  Psychiatric/Behavioral: Negative for altered mental status, depression and suicidal ideas.  Allergic/Immunologic: Negative for HIV exposure and persistent infections.    EKGs/Labs/Other Studies Reviewed:    The following studies were reviewed today:   EKG:  The ekg ordered today demonstrates sinus rhythm, heart rate 95 bpm with frequent PVCs.  TTE 9/15/20216 LV EF: 60% -   65%   -------------------------------------------------------------------  Indications:      Palpitations 785.1.  Tachycardia 785.0.   -------------------------------------------------------------------  History:   Risk factors:  Hypertension.    -------------------------------------------------------------------  Study Conclusions   - Procedure narrative: Transthoracic echocardiography. Technically    difficult study with poor endocardial definition. Intravenous    contrast (Definity) was administered.  - Left ventricle: The cavity size was normal. Wall thickness was    normal. Systolic function was normal. The estimated ejection    fraction was in the range of 60% to 65%. Wall motion was normal;    there were no regional wall motion abnormalities. Left    ventricular diastolic function parameters were normal.  - Left atrium: The atrium was normal in size.  - Systemic veins: Not visualized.   Impressions:   - Compared to the prior study in 2014, there are no significant    changes.   Transthoracic echocardiography.  M-mode, complete 2D, spectral  Doppler, and color Doppler.  Birthdate:  Patient birthdate:  [birth year].  Age:  Patient is 51 yr old.  Sex:  Gender: female.  BMI: 41.2 kg/m^2.  Blood pressure:     110/60  Patient status:  Inpatient.  Study  date:  Study date: 01/08/2015. Study time: 03:59  PM.  Location:  Bedside.   -------------------------------------------------------------------   -------------------------------------------------------------------  Left ventricle:  The cavity size was normal. Wall thickness was  normal. Systolic function was normal. The estimated ejection  fraction was in the range of 60% to 65%. Wall motion was normal;  there were no regional wall motion abnormalities. The transmitral  flow pattern was normal. The deceleration time of the early  transmitral flow velocity was normal. The pulmonary vein flow  pattern was normal. The tissue Doppler parameters were normal. Left  ventricular diastolic function parameters were normal.   -------------------------------------------------------------------  Aortic valve:  Poorly visualized.  Doppler:   There was no  stenosis.   There was no  regurgitation.   -------------------------------------------------------------------  Aorta:  Aortic root: The aortic root was normal in size.  Ascending aorta: The ascending aorta was normal in size.   -------------------------------------------------------------------  Mitral valve:  Poorly visualized.  Doppler:  There was no  significant regurgitation.    Peak gradient (D): 6 mm Hg.   -------------------------------------------------------------------  Left atrium:  The atrium was normal in size.   -------------------------------------------------------------------  Atrial septum:  Poorly visualized.   -------------------------------------------------------------------  Right ventricle:  The cavity size was normal. Wall thickness was  normal. Systolic function was normal.   -------------------------------------------------------------------  Pulmonic valve:    The valve appears to be grossly normal.  Doppler:  There was no significant regurgitation.   -------------------------------------------------------------------  Tricuspid valve:   Doppler:  There was mild regurgitation.   -------------------------------------------------------------------  Pulmonary artery:   The main pulmonary artery was normal-sized.   -------------------------------------------------------------------  Right atrium:  The atrium was normal in size.   -------------------------------------------------------------------  Pericardium:  There was no pericardial effusion.   -------------------------------------------------------------------  Systemic veins:  Not visualized.   ------------------------------------------------  Recent Labs: 05/31/2021: ALT 31; Combs 14; Creatinine, Ser 0.68; Hemoglobin 15.0; Platelets 403.0; Potassium 3.6; Sodium 139; TSH 1.96  Recent Lipid Panel    Component Value Date/Time   CHOL 176 07/07/2020 1023   TRIG 202.0 (H) 07/07/2020 1023   HDL 36.80 (L) 07/07/2020 1023    CHOLHDL 5 07/07/2020 1023   VLDL 40.4 (H) 07/07/2020 1023   LDLCALC 105 (H) 04/07/2016 1042   LDLDIRECT 104.0 07/07/2020 1023    Physical Exam:    VS:  BP (!) 144/86    Pulse 95    Ht 5' 3.5" (1.613 m)    Wt 253 lb 3.2 oz (114.9 kg)    SpO2 97%    BMI 44.15 kg/m     Wt Readings from Last 3 Encounters:  06/03/21 253 lb 3.2 oz (114.9 kg)  05/31/21 252 lb 9.6 oz (114.6 kg)  07/07/20 251 lb 9.6 oz (114.1 kg)     GEN: Well nourished, well developed in no acute distress HEENT: Normal NECK: No JVD; No carotid bruits LYMPHATICS: No lymphadenopathy CARDIAC: S1S2 noted,RRR, 1/6 midsystolic  murmurs, rubs, gallops RESPIRATORY:  Clear to auscultation without rales, wheezing or rhonchi  ABDOMEN: Soft, non-tender, non-distended, +bowel sounds, no guarding. EXTREMITIES: No edema, No cyanosis, no clubbing MUSCULOSKELETAL:  No deformity  SKIN: Warm and dry NEUROLOGIC:  Alert and oriented x 3, non-focal PSYCHIATRIC:  Normal affect, good insight  ASSESSMENT:    1. Frequent PVCs   2. SOB (shortness of breath)   3. Prediabetes   4. Fatigue, unspecified type   5. Daytime somnolence    PLAN:     With shortness of breath and new 1 out of  6 murmur I like to repeat an echocardiogram to assess for any structural abnormalities.  This will also help me in the setting of her frequent PVCs to make sure that her ejection fraction is normal and there is no PVC related cardiomyopathy. I will transition the patient off beta-blocker to start Cardizem channel blocker.  Have educated the patient how to taper off this medication. We will get a sleep study in the setting of her fatigue as well as daytime somnolence to rule out sleep apnea. She does have prediabetes which was back in January 2021 hemoglobin A1c 5.7.  We will repeat this. The patient understands the need to lose weight with diet and exercise. We have discussed specific strategies for this.  The patient is in agreement with the above plan. The  patient left the office in stable condition.  The patient will follow up in 12 weeks or sooner if needed.   Medication Adjustments/Labs and Tests Ordered: Current medicines are reviewed at length with the patient today.  Concerns regarding medicines are outlined above.  Orders Placed This Encounter  Procedures   Hemoglobin A1c   LONG TERM MONITOR (3-14 DAYS)   EKG 12-Lead   ECHOCARDIOGRAM COMPLETE   Split night study   No orders of the defined types were placed in this encounter.   Patient Instructions  Medication Instructions:  Your physician has recommended you make the following change in your medication:  STOP: Metoprolol tartrate (Lopressor) 25 mg tonight and tomorrow morning. START: Cardizem 180 mg (Start noon tomorrow)  *If you need a refill on your cardiac medications before your next appointment, please call your pharmacy*   Lab Work: Your physician recommends that you return for lab work in:  TODAY: HgbA1c If you have labs (blood work) drawn today and your tests are completely normal, you will receive your results only by: MyChart Message (if you have MyChart) OR A paper copy in the mail If you have any lab test that is abnormal or we need to change your treatment, we will call you to review the results.   Testing/Procedures: Your physician has requested that you have an echocardiogram. Echocardiography is a painless test that uses sound waves to create images of your heart. It provides your doctor with information about the size and shape of your heart and how well your hearts chambers and valves are working. This procedure takes approximately one hour. There are no restrictions for this procedure.  ZIO XT- Long Term Monitor Instructions  Your physician has requested you wear a ZIO patch monitor for 14 days.  This is a single patch monitor. [employer] supplies one patch monitor per enrollment. Additional stickers are not available. Please do not apply patch if you  will be having a Nuclear Stress Test,  Echocardiogram, Cardiac CT, MRI, or Chest Xray during the period you would be wearing the  monitor. The patch cannot be worn during these tests. You cannot remove and re-apply the  ZIO XT patch monitor.  Your ZIO patch monitor will be mailed 3 day USPS to your address on file. It may take 3-5 days  to receive your monitor after you have been enrolled.  Once you have received your monitor, please review the enclosed instructions. Your monitor  has already been registered assigning a specific monitor serial # to you.  Billing and Patient Assistance Program Information  We have supplied [employer] with any of your insurance information on file for billing purposes. [employer] offers a sliding scale Patient Assistance  Program for patients that do not have  insurance, or whose insurance does not completely cover the cost of the ZIO monitor.  You must apply for the Patient Assistance Program to qualify for this discounted rate.  To apply, please call [employer] at [phone], select option 4, select option 2, ask to apply for  Patient Assistance Program. [employer] will ask your household income, and how many people  are in your household. They will quote your out-of-pocket cost based on that information.  [employer] will also be able to set up a 3368-month, interest-free payment plan if needed.  Applying the monitor   Shave hair from upper left chest.  Hold abrader disc by orange tab. Rub abrader in 40 strokes over the upper left chest as  indicated in your monitor instructions.  Clean area with 4 enclosed alcohol pads. Let dry.  Apply patch as indicated in monitor instructions. Patch will be placed under collarbone on left  side of chest with arrow pointing upward.  Rub patch adhesive wings for 2 minutes. Remove white label marked "1". Remove the white  label marked "2". Rub patch adhesive wings for 2 additional minutes.  While looking in a mirror, press and release  button in center of patch. A small green light will  flash 3-4 times. This will be your only indicator that the monitor has been turned on.  Do not shower for the first 24 hours. You may shower after the first 24 hours.  Press the button if you feel a symptom. You will hear a small click. Record Date, Time and  Symptom in the Patient Logbook.  When you are ready to remove the patch, follow instructions on the last 2 pages of Patient  Logbook. Stick patch monitor onto the last page of Patient Logbook.  Place Patient Logbook in the blue and white box. Use locking tab on box and tape box closed  securely. The blue and white box has prepaid postage on it. Please place it in the mailbox as  soon as possible. Your physician should have your test results approximately 7 days after the  monitor has been mailed back to [facility].  Call [facility] Customer Care at [phone] if you have questions regarding  your ZIO XT patch monitor. Call them immediately if you see an orange light blinking on your  monitor.  If your monitor falls off in less than 4 days, contact our Monitor department at [phone].  If your monitor becomes loose or falls off after 4 days call [employer] at [phone] for  suggestions on securing your monitor    Follow-Up: At [facility], you and your health needs are our priority.  As part of our continuing mission to provide you with exceptional heart care, we have created designated Provider Care Teams.  These Care Teams include your primary Cardiologist (physician) and Advanced Practice Providers (APPs -  Physician Assistants and Nurse Practitioners) who all work together to provide you with the care you need, when you need it.  We recommend signing up for the patient portal called "MyChart".  Sign up information is provided on this After Visit Summary.  MyChart is used to connect with patients for Virtual Visits (Telemedicine).  Patients are able to view lab/test  results, encounter notes, upcoming appointments, etc.  Non-urgent messages can be sent to your provider as well.   To learn more about what you can do with MyChart, go to [email or web address].    Your next appointment:   12 week(s)  The format for your next appointment:   In Person  Provider:   Thomasene Ripple, DO     Other Instructions     Adopting a Healthy Lifestyle.  Know what a healthy weight is for you (roughly BMI <25) and aim to maintain this   Aim for 7+ servings of fruits and vegetables daily   65-80+ fluid ounces of water or unsweet tea for healthy kidneys   Limit to max 1 drink of alcohol per day; avoid smoking/tobacco   Limit animal fats in diet for cholesterol and heart health - choose grass fed whenever available   Avoid highly processed foods, and foods high in saturated/trans fats   Aim for low stress - take time to unwind and care for your mental health   Aim for 150 min of moderate intensity exercise weekly for heart health, and weights twice weekly for bone health   Aim for 7-9 hours of sleep daily   When it comes to diets, agreement about the perfect plan isnt easy to find, even among the experts. Experts at the [facility] [employer] developed an idea known as the Healthy Eating Plate. Just imagine a plate divided into logical, healthy portions.   The emphasis is on diet quality:   Load up on vegetables and fruits - one-half of your plate: Aim for color and variety, and remember that potatoes dont count.   Go for whole grains - one-quarter of your plate: Whole wheat, barley, wheat berries, quinoa, oats, brown rice, and foods made with them. If you want pasta, go with whole wheat pasta.   Protein power - one-quarter of your plate: Fish, chicken, beans, and nuts are all healthy, versatile protein sources. Limit red meat.   The diet, however, does go beyond the plate, offering a few other suggestions.   Use healthy plant oils, such as  olive, canola, soy, corn, sunflower and peanut. Check the labels, and avoid partially hydrogenated oil, which have unhealthy trans fats.   If youre thirsty, drink water. Coffee and tea are good in moderation, but skip sugary drinks and limit milk and dairy products to one or two daily servings.   The type of carbohydrate in the diet is more important than the amount. Some sources of carbohydrates, such as vegetables, fruits, whole grains, and beans-are healthier than others.   Finally, stay active  Signed, Thomasene Ripple, DO  06/03/2021 5:36 PM    [facility]

## 2021-06-04 ENCOUNTER — Other Ambulatory Visit: Payer: Self-pay

## 2021-06-04 ENCOUNTER — Telehealth: Payer: Self-pay | Admitting: Cardiology

## 2021-06-04 LAB — HEMOGLOBIN A1C
Est. average glucose Bld gHb Est-mCnc: 114 mg/dL
Hgb A1c MFr Bld: 5.6 % (ref 4.8–5.6)

## 2021-06-04 MED ORDER — DILTIAZEM HCL ER COATED BEADS 180 MG PO CP24: 180.0000 mg | ORAL_CAPSULE | Freq: Every day | ORAL | 3 refills | Status: DC

## 2021-06-04 NOTE — Telephone Encounter (Signed)
Patient was seen yesterday by Dr. Servando Salina. A script for cardizem 180mg  was suppose to be written for her, but it never made it to her pharmacy.  She would like for it to go to [id number] - [place], [place] - [address] AT [facility] [address]

## 2021-06-04 NOTE — Telephone Encounter (Signed)
Called pt. No answer at this time. Left message for her to return the call.  Called pt's pharmacy to make sure the medication will be ready today. Staff states it will be ready in 10-15 minutes.

## 2021-06-04 NOTE — Telephone Encounter (Signed)
Spoke with patient who stated her pharmacy did not have the cardizem order. She will take the metoprolol 25 mg this evening and in the morning, then start cardizem at noon tomorrow. Prescription sent to her pharmacy.

## 2021-06-05 ENCOUNTER — Encounter: Payer: Self-pay | Admitting: Cardiology

## 2021-06-07 ENCOUNTER — Encounter: Payer: Self-pay | Admitting: Cardiology

## 2021-06-08 ENCOUNTER — Ambulatory Visit (INDEPENDENT_AMBULATORY_CARE_PROVIDER_SITE_OTHER): Payer: BC Managed Care – PPO

## 2021-06-08 ENCOUNTER — Other Ambulatory Visit: Payer: Self-pay

## 2021-06-08 DIAGNOSIS — R0602 Shortness of breath: Secondary | ICD-10-CM

## 2021-06-08 MED ORDER — PERFLUTREN LIPID MICROSPHERE
1.0000 mL | INTRAVENOUS | Status: AC | PRN
  Administered 2021-06-08: 2 mL via INTRAVENOUS

## 2021-06-09 LAB — ECHOCARDIOGRAM COMPLETE
AR max vel: 1.9 cm2
AV Area VTI: 1.92 cm2
AV Area mean vel: 1.81 cm2
AV Mean grad: 6 mmHg
AV Peak grad: 11.8 mmHg
Ao pk vel: 1.72 m/s
Area-P 1/2: 3.28 cm2
S' Lateral: 3.24 cm

## 2021-06-10 ENCOUNTER — Encounter: Payer: Self-pay | Admitting: Cardiology

## 2021-06-10 ENCOUNTER — Other Ambulatory Visit: Payer: Self-pay

## 2021-06-10 MED ORDER — DILTIAZEM HCL ER COATED BEADS 240 MG PO CP24: 240.0000 mg | ORAL_CAPSULE | Freq: Every day | ORAL | 3 refills | Status: DC

## 2021-06-18 ENCOUNTER — Encounter: Payer: Self-pay | Admitting: Cardiology

## 2021-07-07 ENCOUNTER — Encounter: Payer: Self-pay | Admitting: Cardiology

## 2021-07-09 ENCOUNTER — Encounter: Payer: Self-pay | Admitting: Cardiology

## 2021-07-12 ENCOUNTER — Other Ambulatory Visit: Payer: Self-pay

## 2021-07-12 ENCOUNTER — Telehealth: Payer: Self-pay

## 2021-07-12 MED ORDER — ACEBUTOLOL HCL 200 MG PO CAPS: 200.0000 mg | ORAL_CAPSULE | Freq: Two times a day (BID) | ORAL | 3 refills | Status: DC

## 2021-07-12 NOTE — Progress Notes (Signed)
Prescription sent to pharmacy.

## 2021-07-12 NOTE — Telephone Encounter (Signed)
Called pt to let her know we can only order 200 mg capsules. She verbalized understanding and thanked me for calling her to update her.    ?

## 2021-07-12 NOTE — Telephone Encounter (Signed)
Spoke to pt. See telephone encounter.

## 2021-07-22 ENCOUNTER — Encounter: Payer: Self-pay | Admitting: Cardiology

## 2021-07-23 ENCOUNTER — Other Ambulatory Visit: Payer: Self-pay

## 2021-07-23 MED ORDER — ACEBUTOLOL HCL 400 MG PO CAPS: 400.0000 mg | ORAL_CAPSULE | Freq: Two times a day (BID) | ORAL | 3 refills | Status: DC

## 2021-08-16 ENCOUNTER — Encounter: Payer: BC Managed Care – PPO | Admitting: Family Medicine

## 2021-08-23 ENCOUNTER — Ambulatory Visit (INDEPENDENT_AMBULATORY_CARE_PROVIDER_SITE_OTHER): Payer: BC Managed Care – PPO | Admitting: Family Medicine

## 2021-08-23 ENCOUNTER — Encounter: Payer: Self-pay | Admitting: Family Medicine

## 2021-08-23 VITALS — BP 116/70 | HR 81 | Temp 98.3°F | Ht 63.0 in | Wt 252.8 lb

## 2021-08-23 DIAGNOSIS — E781 Pure hyperglyceridemia: Secondary | ICD-10-CM | POA: Diagnosis not present

## 2021-08-23 DIAGNOSIS — I10 Essential (primary) hypertension: Secondary | ICD-10-CM | POA: Diagnosis not present

## 2021-08-23 DIAGNOSIS — G4700 Insomnia, unspecified: Secondary | ICD-10-CM

## 2021-08-23 DIAGNOSIS — F411 Generalized anxiety disorder: Secondary | ICD-10-CM

## 2021-08-23 DIAGNOSIS — I493 Ventricular premature depolarization: Secondary | ICD-10-CM | POA: Diagnosis not present

## 2021-08-23 DIAGNOSIS — E559 Vitamin D deficiency, unspecified: Secondary | ICD-10-CM

## 2021-08-23 DIAGNOSIS — L719 Rosacea, unspecified: Secondary | ICD-10-CM | POA: Insufficient documentation

## 2021-08-23 DIAGNOSIS — J302 Other seasonal allergic rhinitis: Secondary | ICD-10-CM | POA: Insufficient documentation

## 2021-08-23 LAB — HM MAMMOGRAPHY

## 2021-08-23 NOTE — Progress Notes (Signed)
?[facility] ?[address] ?[place] [address] ?Dept: [phone] ?Dept Fax: [phone] ? ?Transfer of Care Office Visit ? ?Subjective:  ? ? Patient ID: Kayla Combs, female    DOB: [birth year], 51 y.o..   MRN: [id number] ? ?Chief Complaint  ?Patient presents with  ? Transitions Of Care  ?  TOC, no concerns. Patient not fasting.   ? ? ?History of Present Illness: ? ?Patient is in today to establish care. Kayla Combs was born in [place], [place] and grew up in [place]. She attended [employer], getting her degree in nursing. She moved to [place] in 1996. Kayla Combs has been married for 18 years. She has three sons (53, 26, 66). She works in the [place] for [employer]. She denies any tobacco or drug use and only rarely drinks alcohol. ? ?I met Kayla Combs in Feb. when she presented with an irregular heart beat. She was found to have unifocal PVCs every 6th beat. Since then, she has seen Dr. Harriet Masson (cardiology). She is now on acebutolol 400 mg bid and diltiazem 240 mg daily.  She feels much better now that this is improved. ? ?Kayla Combs has a history of essential hypertension, which is well controlled on the diltiazem. ? ?Kayla Combs has a history of lower back pain with a prior bulging disc, spinal stenosis, and left sciatica. She manages this with occasional ibuprofen. She does admit to an area of burnign pain and numbness over her lwoer right back. ? ?Kayla Combs has a history of rosacea. She is managed on metronidazole cream. ? ?Kayla Combs has a history of occasional anxiety. She manages this with a rare dose of Xanax. ? ?Kayla Combs has a history of insomnia. She manages this with a rare dose of Ambien. ? ?Past Medical History: ?Patient Active Problem List  ? Diagnosis Date Noted  ? Frequent PVCs 08/23/2021  ? Rosacea 08/23/2021  ? Seasonal allergic rhinitis 08/23/2021  ? Degenerative lumbar spinal stenosis 06/26/2019   ? Disc displacement, lumbar 06/26/2019  ? Radiculopathy, lumbar region 05/13/2019  ? Vitamin D deficiency 04/19/2018  ? Thyroid nodule 01/21/2015  ? Hypocalcemia 01/21/2015  ? Leukocytosis 01/08/2015  ? Severe obesity (BMI >= 40) ([place]) 04/02/2013  ? Insomnia 11/27/2012  ? Hypertriglyceridemia 03/06/2012  ? Essential hypertension 03/06/2012  ? Generalized anxiety disorder 03/06/2012  ? Sciatica of left side 12/17/2008  ? ?Past Surgical History:  ?Procedure Laterality Date  ? CESAREAN SECTION    ? x 3   ? DILITATION & CURRETTAGE/HYSTROSCOPY WITH HYDROTHERMAL ABLATION N/A 06/11/2020  ? Procedure: DILATATION & CURETTAGE/HYSTEROSCOPY WITH HYDROTHERMAL ABLATION, CERVICAL BIOPSY;  Surgeon: Linda Hedges, DO;  Location: [facility];  Service: Gynecology;  Laterality: N/A;  ? DILITATION & CURRETTAGE/HYSTROSCOPY WITH HYDROTHERMAL ABLATION  06/05/2020  ? NAILBED REPAIR Right 10/16/2018  ? Procedure: BIOPSY RIGHT NAILBED MIDDLE FINGER;  Surgeon: Daryll Brod, MD;  Location: [facility];  Service: Orthopedics;  Laterality: Right;  IV REGIONAL FOREARM BLOCK  ? TONSILLECTOMY AND ADENOIDECTOMY  as child age 17  ? WISDOM TOOTH EXTRACTION    ? [id number]  ? ?Family History  ?Problem Relation Age of Onset  ? Hypertension Mother   ? Cervical cancer Mother   ? Hypertension Father   ? Colon cancer Neg Hx   ? Colon polyps Neg Hx   ? Esophageal cancer Neg Hx   ? Rectal cancer Neg Hx   ? Stomach cancer Neg Hx   ? ?Outpatient  Medications Prior to Visit  ?Medication Sig Dispense Refill  ? acebutolol (SECTRAL) 400 MG capsule Take 1 capsule (400 mg total) by mouth 2 (two) times daily. 180 capsule 3  ? ALPRAZolam (XANAX) 0.25 MG tablet Take 1 tablet (0.25 mg total) by mouth daily as needed for anxiety. 90 tablet 0  ? Cholecalciferol (VITAMIN D) 2000 UNITS tablet Take 2,000 Units by mouth daily.    ? diltiazem (CARDIZEM CD) 240 MG 24 hr capsule Take 1 capsule (240 mg total) by mouth daily. 90 capsule 3  ? fluticasone  (FLONASE) 50 MCG/ACT nasal spray Place 1 spray into both nostrils as needed for allergies.    ? ibuprofen (ADVIL) 800 MG tablet Take 1 tablet (800 mg total) by mouth every 6 (six) hours as needed. 30 tablet 0  ? metroNIDAZOLE (METROCREAM) 0.75 % cream Apply 1 a small amount to affected area once a day    ? Multiple Vitamins-Minerals (CENTRUM SILVER PO) Take by mouth.    ? zolpidem (AMBIEN) 10 MG tablet Take 1 tablet (10 mg total) by mouth at bedtime as needed for sleep. 90 tablet 0  ? ?No facility-administered medications prior to visit.  ? ?Allergies  ?Allergen Reactions  ? Aleve [Naproxen Sodium] Itching  ?  ?Objective:  ? ?Today's Vitals  ? 08/23/21 1525  ?BP: 116/70  ?Pulse: 81  ?Temp: 98.3 ?F (36.8 ?C)  ?TempSrc: Temporal  ?SpO2: 97%  ?Weight: 252 lb 12.8 oz (114.7 kg)  ?Height: '5\' 3"'  (1.6 m)  ? ?Body mass index is 44.78 kg/m?.  ? ?General: Well developed, well nourished. No acute distress. ?HEENT: Normocephalic, non-traumatic. PERRL, EOMI. Conjunctiva clear. Fundiscopic exam shows  ? normal disc and vasculature. External ears normal. EAC and TMs normal bilaterally. Nose   ? clear without congestion or rhinorrhea. Mucous membranes moist. Oropharynx clear. Good dentition. ?Neck: Supple. No lymphadenopathy. No thyromegaly. ?Lungs: Clear to auscultation bilaterally. No wheezing, rales or rhonchi. ?CV: RRR without murmurs or rubs. Pulses 2+ bilaterally. ?Back: Straight. No CVA tenderness bilaterally. ?Extremities: Full ROM. No joint swelling or tenderness. Mild popping of the knees bilat. No edema  ? noted. ?Skin: Warm and dry. No rashes. ?Psych: Alert and oriented. Normal mood and affect. ? ?Health Maintenance Due  ?Topic Date Due  ? HIV Screening  Never done  ? Hepatitis C Screening  Never done  ?   ?Assessment & Plan:  ? ?1. Essential hypertension ?Blood pressure is at goal on diltiazem 240 mg daily. ? ?2. Frequent PVCs ?Well controlled on diltiazem and acebutolol. Kayla Combs will follow up with Dr. Harriet Masson on  5/8. ? ?3. Hypertriglyceridemia ?We will reassess fasting lipids. ? ?- Lipid panel; Future ? ?4. Insomnia, unspecified type ?Stable with rare Ambien use. ? ?5. Generalized anxiety disorder ?Stable with rare Xanax use. ? ?6. Vitamin D deficiency ?On OTC Vit D maintenance. I will reassess ehr Vitamin D level. ? ?- VITAMIN D 25 Hydroxy (Vit-D Deficiency, Fractures); Future ? ?Return in about 4 months (around 12/24/2021) for Reassessment.  ? ?Haydee Salter, MD ?

## 2021-08-24 ENCOUNTER — Encounter: Payer: Self-pay | Admitting: Family Medicine

## 2021-08-25 ENCOUNTER — Other Ambulatory Visit (INDEPENDENT_AMBULATORY_CARE_PROVIDER_SITE_OTHER): Payer: BC Managed Care – PPO

## 2021-08-25 DIAGNOSIS — E781 Pure hyperglyceridemia: Secondary | ICD-10-CM | POA: Diagnosis not present

## 2021-08-25 DIAGNOSIS — E559 Vitamin D deficiency, unspecified: Secondary | ICD-10-CM | POA: Diagnosis not present

## 2021-08-26 ENCOUNTER — Encounter: Payer: Self-pay | Admitting: Family Medicine

## 2021-08-26 LAB — LIPID PANEL
Cholesterol: 171 mg/dL (ref 0–200)
HDL: 36.2 mg/dL — ABNORMAL LOW (ref >39.00)
NonHDL: 134.76
Total CHOL/HDL Ratio: 5
Triglycerides: 226 mg/dL — ABNORMAL HIGH (ref 0.0–149.0)
VLDL: 45.2 mg/dL — ABNORMAL HIGH (ref 0.0–40.0)

## 2021-08-26 LAB — LDL CHOLESTEROL, DIRECT: Direct LDL: 98 mg/dL

## 2021-08-26 LAB — VITAMIN D 25 HYDROXY (VIT D DEFICIENCY, FRACTURES): VITD: 27.15 ng/mL — ABNORMAL LOW (ref 30.00–100.00)

## 2021-08-27 ENCOUNTER — Encounter: Payer: Self-pay | Admitting: Family Medicine

## 2021-08-30 ENCOUNTER — Encounter: Payer: Self-pay | Admitting: Cardiology

## 2021-08-30 ENCOUNTER — Ambulatory Visit (INDEPENDENT_AMBULATORY_CARE_PROVIDER_SITE_OTHER): Payer: BC Managed Care – PPO | Admitting: Cardiology

## 2021-08-30 VITALS — BP 150/94 | HR 80 | Ht 63.5 in | Wt 249.4 lb

## 2021-08-30 DIAGNOSIS — R400 Somnolence: Secondary | ICD-10-CM | POA: Diagnosis not present

## 2021-08-30 DIAGNOSIS — I493 Ventricular premature depolarization: Secondary | ICD-10-CM

## 2021-08-30 DIAGNOSIS — E781 Pure hyperglyceridemia: Secondary | ICD-10-CM | POA: Diagnosis not present

## 2021-08-30 DIAGNOSIS — R5383 Other fatigue: Secondary | ICD-10-CM | POA: Diagnosis not present

## 2021-08-30 DIAGNOSIS — I10 Essential (primary) hypertension: Secondary | ICD-10-CM

## 2021-08-30 NOTE — Patient Instructions (Addendum)
Medication Instructions:  ?Your physician recommends that you continue on your current medications as directed. Please refer to the Current Medication list given to you today.  ?*If you need a refill on your cardiac medications before your next appointment, please call your pharmacy* ? ? ?Lab Work: ?Your physician recommends that you return for lab work in:  ?In 12 weeks: Lipids - please come fasting ?If you have labs (blood work) drawn today and your tests are completely normal, you will receive your results only by: ?MyChart Message (if you have MyChart) OR ?A paper copy in the mail ?If you have any lab test that is abnormal or we need to change your treatment, we will call you to review the results. ? ? ?Testing/Procedures: ?Your physician has recommended that you have a sleep study. This test records several body functions during sleep, including: brain activity, eye movement, oxygen and carbon dioxide blood levels, heart rate and rhythm, breathing rate and rhythm, the flow of air through your mouth and nose, snoring, body muscle movements, and chest and belly movement. ? ? ? ?Follow-Up: ?At [facility], you and your health needs are our priority.  As part of our continuing mission to provide you with exceptional heart care, we have created designated Provider Care Teams.  These Care Teams include your primary Cardiologist (physician) and Advanced Practice Providers (APPs -  Physician Assistants and Nurse Practitioners) who all work together to provide you with the care you need, when you need it. ? ?We recommend signing up for the patient portal called "MyChart".  Sign up information is provided on this After Visit Summary.  MyChart is used to connect with patients for Virtual Visits (Telemedicine).  Patients are able to view lab/test results, encounter notes, upcoming appointments, etc.  Non-urgent messages can be sent to your provider as well.   ?To learn more about what you can do with MyChart, go to  [email or web address].   ? ?Your next appointment:   ?6 month(s) ? ?The format for your next appointment:   ?In Person ? ?Provider:   ?Thomasene Ripple, DO   ? ? ?Other Instructions ? ? ?Important Information About Sugar ? ? ? ? ?  ?

## 2021-08-30 NOTE — Progress Notes (Addendum)
?Cardiology Office Note:   ? ?Date:  08/31/2021  ? ?ID:  Kayla Combs, DOB 19-Feb-1971, MRN [id number] ? ?PCP:  Kayla Mast, MD  ?Cardiologist:  Kayla Ripple, DO  ?Electrophysiologist:  None  ? ?Referring MD: Kayla Hatch, MD  ? ?" I am doing better" ? ?History of Present Illness:   ? ?Kayla Combs is a 51 y.o. female with a hx of hypertension, anxiety, hyperlipidemia, obesity and frequent PVC here today for follow-up visit.  I initially saw the patient on June 03, 2021 at that time she was experiencing intermittent palpitation she also admitted to fatigue as well as daytime somnolence.  I placed a monitor on the patient and also repeat her echocardiogram.  We ordered a sleep study. ? ?In the interim she did wear the monitor we did show symptomatic PVCs, rare paroxysmal SVT and Mobitz 1 which happened overnight. ? ?Since her last visit we optimize her medication she is currently now on acebutolol 400 mg twice a day and Cardizem 240 milligrams daily. ? ?Past Medical History:  ?Diagnosis Date  ? Allergy   ? Anxiety   ? Arthritis   ? lower back   ? Depression   ? History of asthma as young adult  ? Hyperlipidemia   ? diet controlled  ? Hypertension   ? Migraine headache   ? PONV (postoperative nausea and vomiting)   ? Wears glasses   ? ? ?Past Surgical History:  ?Procedure Laterality Date  ? CESAREAN SECTION    ? x 3   ? DILITATION & CURRETTAGE/HYSTROSCOPY WITH HYDROTHERMAL ABLATION N/A 06/11/2020  ? Procedure: DILATATION & CURETTAGE/HYSTEROSCOPY WITH HYDROTHERMAL ABLATION, CERVICAL BIOPSY;  Surgeon: Mitchel Honour, DO;  Location: [facility];  Service: Gynecology;  Laterality: N/A;  ? DILITATION & CURRETTAGE/HYSTROSCOPY WITH HYDROTHERMAL ABLATION  06/05/2020  ? NAILBED REPAIR Right 10/16/2018  ? Procedure: BIOPSY RIGHT NAILBED MIDDLE FINGER;  Surgeon: Cindee Salt, MD;  Location: [facility];  Service: Orthopedics;  Laterality: Right;  IV REGIONAL FOREARM BLOCK  ?  TONSILLECTOMY AND ADENOIDECTOMY  as child age 88  ? WISDOM TOOTH EXTRACTION    ? 1986-1987  ? ? ?Current Medications: ?Current Meds  ?Medication Sig  ? acebutolol (SECTRAL) 400 MG capsule Take 1 capsule (400 mg total) by mouth 2 (two) times daily.  ? ALPRAZolam (XANAX) 0.25 MG tablet Take 1 tablet (0.25 mg total) by mouth daily as needed for anxiety.  ? diltiazem (CARDIZEM CD) 240 MG 24 hr capsule Take 1 capsule (240 mg total) by mouth daily.  ? fluticasone (FLONASE) 50 MCG/ACT nasal spray Place 1 spray into both nostrils as needed for allergies.  ? ibuprofen (ADVIL) 800 MG tablet Take 1 tablet (800 mg total) by mouth every 6 (six) hours as needed.  ? metroNIDAZOLE (METROCREAM) 0.75 % cream as needed.  ? Multiple Vitamins-Minerals (CENTRUM SILVER PO) Take by mouth.  ? VITAMIN D PO Take 5,000 Units by mouth daily.  ? zolpidem (AMBIEN) 10 MG tablet Take 1 tablet (10 mg total) by mouth at bedtime as needed for sleep.  ?  ? ?Allergies:   Aleve [naproxen sodium]  ? ?Social History  ? ?Socioeconomic History  ? Marital status: Married  ?  Spouse name: Not on file  ? Number of children: 3  ? Years of education: Not on file  ? Highest education level: Bachelor's degree (e.g., BA, AB, BS)  ?Occupational History  ? Occupation: Charity fundraiser  ?  Comment: [employer]  ?Tobacco Use  ?  Smoking status: Former  ?  Packs/day: 0.30  ?  Years: 10.00  ?  Pack years: 3.00  ?  Types: Cigarettes  ?  Quit date: 03/26/2011  ?  Years since quitting: 10.4  ? Smokeless tobacco: Never  ?Vaping Use  ? Vaping Use: Never used  ?Substance and Sexual Activity  ? Alcohol use: Yes  ?  Alcohol/week: 0.0 - 4.0 standard drinks  ?  Comment: social  ? Drug use: No  ? Sexual activity: Not Currently  ?  Birth control/protection: Surgical  ?  Comment: husband vasectomy  ?Other Topics Concern  ? Not on file  ?Social History Narrative  ? Not on file  ? ?Social Determinants of Health  ? ?Financial Resource Strain: Not on file  ?Food Insecurity: Not on file   ?Transportation Needs: Not on file  ?Physical Activity: Not on file  ?Stress: Not on file  ?Social Connections: Not on file  ?  ? ?Family History: ?The patient's family history includes Cervical cancer in her mother; Hypertension in her father and mother. There is no history of Colon cancer, Colon polyps, Esophageal cancer, Rectal cancer, or Stomach cancer. ? ?ROS:   ?Review of Systems  ?Constitution: Negative for decreased appetite, fever and weight gain.  ?HENT: Negative for congestion, ear discharge, hoarse voice and sore throat.   ?Eyes: Negative for discharge, redness, vision loss in right eye and visual halos.  ?Cardiovascular: Negative for chest pain, dyspnea on exertion, leg swelling, orthopnea and palpitations.  ?Respiratory: Negative for cough, hemoptysis, shortness of breath and snoring.   ?Endocrine: Negative for heat intolerance and polyphagia.  ?Hematologic/Lymphatic: Negative for bleeding problem. Does not bruise/bleed easily.  ?Skin: Negative for flushing, nail changes, rash and suspicious lesions.  ?Musculoskeletal: Negative for arthritis, joint pain, muscle cramps, myalgias, neck pain and stiffness.  ?Gastrointestinal: Negative for abdominal pain, bowel incontinence, diarrhea and excessive appetite.  ?Genitourinary: Negative for decreased libido, genital sores and incomplete emptying.  ?Neurological: Negative for brief paralysis, focal weakness, headaches and loss of balance.  ?Psychiatric/Behavioral: Negative for altered mental status, depression and suicidal ideas.  ?Allergic/Immunologic: Negative for HIV exposure and persistent infections.  ? ? ?EKGs/Labs/Other Studies Reviewed:   ? ?The following studies were reviewed today: ? ? ?EKG: None today ? ?ZIO monitor July 05, 2021 ?Patch Wear Time:  14 days and 0 hours (2023-02-09T16:50:45-0500 to 2023-02-23T16:50:49-0500) ?  ?Patient had a min HR of 28 bpm, max HR of 166 bpm, and avg HR of 88 bpm. Predominant underlying rhythm was Sinus Rhythm. ?   ?1 run of Supraventricular Tachycardia occurred lasting 4 beats with a max rate of 152 bpm (avg 145 bpm). ? Second Degree AV Block-Mobitz I (Wenckebach) was present on 06/11/2021 at 12:04 am.  ?  ?Isolated SVEs were rare (<1.0%), SVE Couplets were rare (<1.0%), and SVE Triplets were rare (<1.0%). Isolated VEs were occasional (3.4%, 60376), VE Couplets were rare (<1.0%, 322), and no VE Triplets were present. ? Ventricular Bigeminy and Trigeminy were present.  ?  ?Symptoms associated with sinus tachycardia.  ?  ?The study is remarkable for the following:  ?                      1. Rare paroxysmal supraventricular tachycardia  ?                      2. Occasional Premature ventricular complexes ?  3. Second Degree AV Block-Mobitz I (Wenckebach) was present on 06/11/2021 at 12:04 am  ( overnight hours raising suspicion for sleep apnea. ? ?Transthoracic echocardiogram 06/08/2021 IMPRESSIONS  ? ? 1. Left ventricular ejection fraction, by estimation, is 60 to 65%. The left ventricle has normal function. The left ventricle has no regional  ?wall motion abnormalities. Left ventricular diastolic parameters were normal.  ? 2. Right ventricular systolic function is normal. The right ventricular  ?size is normal.  ? 3. Left atrial size was moderately dilated.  ? 4. Possible PFO.  ? 5. The mitral valve is abnormal. Trivial mitral valve regurgitation. No  ?evidence of mitral stenosis.  ? 6. The aortic valve is tricuspid. There is mild calcification of the  ?aortic valve. Aortic valve regurgitation is not visualized. Aortic valve  ?sclerosis is present, with no evidence of aortic valve stenosis.  ? 7. The inferior vena cava is normal in size with greater than 50%  ?respiratory variability, suggesting right atrial pressure of 3 mmHg.  ? ?Comparison(s): EF 60 %.  ? ?FINDINGS  ? Left Ventricle: Left ventricular ejection fraction, by estimation, is 60  ?to 65%. The left ventricle has normal function. The left  ventricle has no  ?regional wall motion abnormalities. Definity contrast agent was given IV  ?to delineate the left ventricular  ? endocardial borders. The left ventricular internal cavity size was normal  ?in size. There i

## 2021-08-31 DIAGNOSIS — R5383 Other fatigue: Secondary | ICD-10-CM | POA: Insufficient documentation

## 2021-08-31 DIAGNOSIS — R400 Somnolence: Secondary | ICD-10-CM | POA: Insufficient documentation

## 2021-08-31 DIAGNOSIS — G4733 Obstructive sleep apnea (adult) (pediatric): Secondary | ICD-10-CM | POA: Insufficient documentation

## 2021-09-01 ENCOUNTER — Encounter: Payer: Self-pay | Admitting: Cardiology

## 2021-09-03 ENCOUNTER — Other Ambulatory Visit: Payer: Self-pay

## 2021-09-03 MED ORDER — DILTIAZEM HCL ER COATED BEADS 240 MG PO CP24: 240.0000 mg | ORAL_CAPSULE | Freq: Every day | ORAL | 3 refills | Status: DC

## 2021-09-03 MED ORDER — ACEBUTOLOL HCL 400 MG PO CAPS: 400.0000 mg | ORAL_CAPSULE | Freq: Two times a day (BID) | ORAL | 3 refills | Status: DC

## 2021-09-09 ENCOUNTER — Telehealth: Payer: Self-pay | Admitting: *Deleted

## 2021-09-09 NOTE — Telephone Encounter (Signed)
Prior Authorization for HST sent to [facility] via Phone.No PA required. Call  Reference # [id number].

## 2021-11-03 ENCOUNTER — Encounter (HOSPITAL_BASED_OUTPATIENT_CLINIC_OR_DEPARTMENT_OTHER): Payer: Self-pay

## 2021-11-03 ENCOUNTER — Ambulatory Visit (HOSPITAL_BASED_OUTPATIENT_CLINIC_OR_DEPARTMENT_OTHER): Payer: BC Managed Care – PPO | Admitting: Cardiology

## 2021-11-03 DIAGNOSIS — R400 Somnolence: Secondary | ICD-10-CM

## 2021-11-03 DIAGNOSIS — R5383 Other fatigue: Secondary | ICD-10-CM

## 2021-11-04 NOTE — Procedures (Signed)
Erroneous encounter

## 2021-11-06 ENCOUNTER — Telehealth: Payer: Self-pay | Admitting: *Deleted

## 2021-11-06 NOTE — Telephone Encounter (Signed)
Please find out if this patient completed her home sleep study and handed back in 

## 2021-11-08 NOTE — Telephone Encounter (Signed)
Patient called to say she is returning her sleep test today after work.

## 2021-11-08 NOTE — Telephone Encounter (Signed)
Sleep lab says she has not returned her home sleep study.

## 2021-11-09 NOTE — Progress Notes (Signed)
This encounter was created in error - please disregard.

## 2021-11-24 ENCOUNTER — Ambulatory Visit (HOSPITAL_BASED_OUTPATIENT_CLINIC_OR_DEPARTMENT_OTHER): Payer: BC Managed Care – PPO | Attending: Cardiology | Admitting: Cardiovascular Disease

## 2021-11-24 DIAGNOSIS — G4733 Obstructive sleep apnea (adult) (pediatric): Secondary | ICD-10-CM | POA: Diagnosis not present

## 2021-11-24 DIAGNOSIS — R0683 Snoring: Secondary | ICD-10-CM | POA: Diagnosis present

## 2021-11-24 DIAGNOSIS — R400 Somnolence: Secondary | ICD-10-CM | POA: Diagnosis not present

## 2021-11-24 DIAGNOSIS — R002 Palpitations: Secondary | ICD-10-CM | POA: Diagnosis not present

## 2021-11-24 DIAGNOSIS — R5383 Other fatigue: Secondary | ICD-10-CM

## 2021-11-30 ENCOUNTER — Encounter (HOSPITAL_BASED_OUTPATIENT_CLINIC_OR_DEPARTMENT_OTHER): Payer: Self-pay | Admitting: Cardiovascular Disease

## 2021-11-30 NOTE — Procedures (Signed)
      Patient Name: Kayla Combs, Kayla Combs Date: 11/26/2021 Gender: Female D.O.B: December 28, 1970 Age (years): 50 Referring Provider: Lavona Mound Tobb DO Height (inches): 63 Interpreting Physician: Nicki Guadalajara MD, ABSM Weight (lbs): 253 RPSGT: [place] BMI: 45 MRN: [id number] Neck Size: 14.00  CLINICAL INFORMATION Sleep Study Type: HST  Indication for sleep study: Snoring, fatigue, daytime somnolence, nocturnal palpitations  Epworth Sleepiness Score: 5  SLEEP STUDY TECHNIQUE A multi-channel overnight portable sleep study was performed. The channels recorded were: nasal airflow, thoracic respiratory movement, and oxygen saturation with a pulse oximetry. Snoring was also monitored.  MEDICATIONS acebutolol (SECTRAL) 400 MG capsule ALPRAZolam (XANAX) 0.25 MG tablet diltiazem (CARDIZEM CD) 240 MG 24 hr capsule fluticasone (FLONASE) 50 MCG/ACT nasal spray ibuprofen (ADVIL) 800 MG tablet metroNIDAZOLE (METROCREAM) 0.75 % cream Multiple Vitamins-Minerals (CENTRUM SILVER PO) VITAMIN D PO zolpidem (AMBIEN) 10 MG tablet (Expired) Patient self administered medications include: N/A.  SLEEP ARCHITECTURE Patient was studied for 364.9 minutes. The sleep efficiency was 100.0 % and the patient was supine for 0%. The arousal index was 0.0 per hour.  RESPIRATORY PARAMETERS The overall AHI was 49.5 per hour, with a central apnea index of 0 per hour. Events were worse with supine sleep (AHI 57.8/h).  The oxygen nadir was 85% during sleep.  CARDIAC DATA Mean heart rate during sleep was 70.0 bpm. Heart rate range was 59 - 90 bpm.  IMPRESSIONS - Severe obstructive sleep apnea occurred during this study (AHI 49.5/h). - Moderate oxygen desaturation to a nadir of 85%. - Patient snored for 239.7 minutes (65.7%) during the sleep.  DIAGNOSIS - Obstructive Sleep Apnea (G47.33)  RECOMMENDATIONS - In this patient with cardiovasculoar co-morbidities recommend an in-lab CPAP  titration study to determine optimal treatment of her severe sleep apnea. If unable to obtain an in-lab titration, initiate Auto-PAP with EPR of 3 at 7 - 20 cm of water.  - Effort should be made to optimize nasal and oropharyngeal patency. - Positional therapy avoiding supine position during sleep. - Avoid alcohol, sedatives and other CNS depressants that may worsen sleep apnea and disrupt normal sleep architecture. - Sleep hygiene should be reviewed to assess factors that may improve sleep quality. - Weight management (BMI 45) and regular exercise should be initiated or continued. - Recommend a download and sleep clinic evaluation after 4 weeks of therapy.   [Electronically signed] 11/30/2021 01:52 PM  Nicki Guadalajara MD, [facility], ABSM Diplomate, American Board of Sleep Medicine  NPI: [id number]  [facility] PH: [phone]   FX: [phone] ACCREDITED BY THE AMERICAN ACADEMY OF SLEEP MEDICINE

## 2021-12-07 ENCOUNTER — Telehealth: Payer: Self-pay | Admitting: *Deleted

## 2021-12-07 NOTE — Telephone Encounter (Signed)
-----   Message from Lennette Bihari, MD sent at 11/30/2021  1:57 PM EDT ----- Burna Mortimer, please notify pt of results; try for in-lab titration, if unable, then Auto-PAP

## 2021-12-07 NOTE — Telephone Encounter (Signed)
Patient notified of HST results and recommendations. She agrees to proceed with CPAP therapy. ?

## 2021-12-28 ENCOUNTER — Encounter: Payer: Self-pay | Admitting: Family Medicine

## 2021-12-28 ENCOUNTER — Ambulatory Visit (INDEPENDENT_AMBULATORY_CARE_PROVIDER_SITE_OTHER): Payer: BC Managed Care – PPO | Admitting: Family Medicine

## 2021-12-28 VITALS — BP 126/78 | HR 87 | Temp 97.6°F | Ht 63.0 in | Wt 253.8 lb

## 2021-12-28 DIAGNOSIS — I10 Essential (primary) hypertension: Secondary | ICD-10-CM

## 2021-12-28 DIAGNOSIS — E781 Pure hyperglyceridemia: Secondary | ICD-10-CM

## 2021-12-28 DIAGNOSIS — G4733 Obstructive sleep apnea (adult) (pediatric): Secondary | ICD-10-CM

## 2021-12-28 DIAGNOSIS — Z23 Encounter for immunization: Secondary | ICD-10-CM

## 2021-12-28 DIAGNOSIS — Z6841 Body Mass Index (BMI) 40.0 and over, adult: Secondary | ICD-10-CM

## 2021-12-28 MED ORDER — WEGOVY 0.25 MG/0.5ML ~~LOC~~ SOAJ: 0.2500 mg | SUBCUTANEOUS | 0 refills | Status: DC

## 2021-12-28 MED ORDER — WEGOVY 0.5 MG/0.5ML ~~LOC~~ SOAJ: 0.5000 mg | SUBCUTANEOUS | 0 refills | Status: DC

## 2021-12-28 NOTE — Progress Notes (Signed)
[facility] PRIMARY CARE [facility] PRIMARY Trecia [facility] [address] [place] [address] Dept: [phone] Dept Fax: [phone]  Chronic Care Office Visit  Subjective:    Patient ID: Kayla Combs, female    DOB: 09/12/1970, 51 y.o..   MRN: [id number]  Chief Complaint  Patient presents with   Follow-up    4 month f/u.  Wants flu shot today.  Discuss weight loss meds.      History of Present Illness:  Patient is in today for reassessment of chronic medical issues.  Kayla Combs has a history of essential hypertension, which is well controlled on the diltiazem 240 mg daily.   Kayla Combs has a history of hypertriglyceridemia. She has added a daily fish oil supplement to her regimen.    Kayla Combs has completed a sleep study. She was found to have severe obstructive sleep apnea. She is still awaiting insurance clearance related to obtaining a CPAP.  Kayla Combs notes struggles she has had with weight loss. She has made efforts to reduce extra calories by reducing alcohol intake, reducing carbs, reducing sugars, and selecting low fat options when available. She has found that exercise has been a challenge. However, her husband is supportive of trying to exercise more frequently.  Past Medical History: Patient Active Problem List   Diagnosis Date Noted   Obstructive sleep apnea 08/31/2021   Frequent PVCs 08/23/2021   Rosacea 08/23/2021   Seasonal allergic rhinitis 08/23/2021   Degenerative lumbar spinal stenosis 06/26/2019   Disc displacement, lumbar 06/26/2019   Radiculopathy, lumbar region 05/13/2019   Vitamin D deficiency 04/19/2018   Thyroid nodule 01/21/2015   Hypocalcemia 01/21/2015   Leukocytosis 01/08/2015   Morbid obesity with BMI of 40.0-44.9, adult (HCC) 04/02/2013   Insomnia 11/27/2012   Hypertriglyceridemia 03/06/2012   Essential hypertension 03/06/2012   Generalized anxiety disorder 03/06/2012   Sciatica of left side 12/17/2008    Past Surgical History:  Procedure Laterality Date   CESAREAN SECTION     x 3    DILITATION & CURRETTAGE/HYSTROSCOPY WITH HYDROTHERMAL ABLATION N/A 06/11/2020   Procedure: DILATATION & CURETTAGE/HYSTEROSCOPY WITH HYDROTHERMAL ABLATION, CERVICAL BIOPSY;  Surgeon: Mitchel Honour, DO;  Location: [facility];  Service: Gynecology;  Laterality: N/A;   DILITATION & CURRETTAGE/HYSTROSCOPY WITH HYDROTHERMAL ABLATION  06/05/2020   NAILBED REPAIR Right 10/16/2018   Procedure: BIOPSY RIGHT NAILBED MIDDLE FINGER;  Surgeon: Cindee Salt, MD;  Location: [facility];  Service: Orthopedics;  Laterality: Right;  IV REGIONAL FOREARM BLOCK   TONSILLECTOMY AND ADENOIDECTOMY  as child age 40   WISDOM TOOTH EXTRACTION     1986-1987   Family History  Problem Relation Age of Onset   Hypertension Mother    Cervical cancer Mother    Hypertension Father    Colon cancer Neg Hx    Colon polyps Neg Hx    Esophageal cancer Neg Hx    Rectal cancer Neg Hx    Stomach cancer Neg Hx    Outpatient Medications Prior to Visit  Medication Sig Dispense Refill   acebutolol (SECTRAL) 400 MG capsule Take 1 capsule (400 mg total) by mouth 2 (two) times daily. 180 capsule 3   ALPRAZolam (XANAX) 0.25 MG tablet Take 1 tablet (0.25 mg total) by mouth daily as needed for anxiety. 90 tablet 0   diltiazem (CARDIZEM CD) 240 MG 24 hr capsule Take 1 capsule (240 mg total) by mouth daily. 90 capsule 3   fluticasone (FLONASE) 50 MCG/ACT nasal spray Place 1 spray into  both nostrils as needed for allergies.     ibuprofen (ADVIL) 200 MG tablet Take 200 mg by mouth every 6 (six) hours as needed.     metroNIDAZOLE (METROCREAM) 0.75 % cream as needed.     Multiple Vitamins-Minerals (CENTRUM SILVER PO) Take by mouth.     VITAMIN D PO Take 5,000 Units by mouth daily.     zolpidem (AMBIEN) 10 MG tablet Take 1 tablet (10 mg total) by mouth at bedtime as needed for sleep. 90 tablet 0   ibuprofen (ADVIL) 800 MG tablet  Take 1 tablet (800 mg total) by mouth every 6 (six) hours as needed. 30 tablet 0   No facility-administered medications prior to visit.   Allergies  Allergen Reactions   Aleve [Naproxen Sodium] Itching   Objective:   Today's Vitals   12/28/21 1625  BP: 126/78  Pulse: 87  Temp: 97.6 F (36.4 C)  TempSrc: Temporal  SpO2: 96%  Weight: 253 lb 12.8 oz (115.1 kg)  Height: 5\' 3"  (1.6 m)   Body mass index is 44.96 kg/m.   General: Well developed, well nourished. No acute distress. Psych: Alert and oriented. Normal mood and affect.  Health Maintenance Due  Topic Date Due   Zoster Vaccines- Shingrix (1 of 2) Never done   INFLUENZA VACCINE  11/23/2021    Lab Results Last lipids Lab Results  Component Value Date   CHOL 171 08/25/2021   HDL 36.20 (L) 08/25/2021   LDLCALC 105 (H) 04/07/2016   LDLDIRECT 98.0 08/25/2021   TRIG 226.0 (H) 08/25/2021   CHOLHDL 5 08/25/2021   Last vitamin D Lab Results  Component Value Date   VD25OH 27.15 (L) 08/25/2021     Assessment & Plan:   1. Essential hypertension Blood pressure is at goal. Continue diltiazem 240 mg daily.   2. Hypertriglyceridemia Kayla Combs has added fish oil. We will plan to check a fasting lipids at her next visit.  3. Morbid obesity with BMI of 40.0-44.9, adult (HCC) Maximum weight; 267 lb (04/2019)  We discussed the baseline approach to weight management involving efforts at reducing calories and increasing physical activity. I reviewed the risk and benefits of medication to aid in weight loss. I will attempt to prescribe Wegovy to her. I recommend we follow up in 2 months to monitor medication use and her weight loss.  - Semaglutide-Weight Management (WEGOVY) 0.25 MG/0.5ML SOAJ; Inject 0.25 mg into the skin once a week.  Dispense: 2 mL; Refill: 0 - Semaglutide-Weight Management (WEGOVY) 0.5 MG/0.5ML SOAJ; Inject 0.5 mg into the skin once a week. To start after 28 days on the 0.25 mg dose  Dispense: 2 mL;  Refill: 0  4. Obstructive sleep apnea Pending starting on CPAP.  Return in about 2 months (around 02/27/2022) for Reassessment.   13/08/2021, MD

## 2021-12-29 ENCOUNTER — Encounter: Payer: Self-pay | Admitting: Family Medicine

## 2021-12-29 ENCOUNTER — Telehealth: Payer: Self-pay | Admitting: *Deleted

## 2021-12-29 ENCOUNTER — Other Ambulatory Visit: Payer: Self-pay | Admitting: Cardiovascular Disease

## 2021-12-29 DIAGNOSIS — G4700 Insomnia, unspecified: Secondary | ICD-10-CM

## 2021-12-29 DIAGNOSIS — I10 Essential (primary) hypertension: Secondary | ICD-10-CM

## 2021-12-29 DIAGNOSIS — G4733 Obstructive sleep apnea (adult) (pediatric): Secondary | ICD-10-CM

## 2021-12-29 MED ORDER — WEGOVY 0.5 MG/0.5ML ~~LOC~~ SOAJ: 0.5000 mg | SUBCUTANEOUS | 0 refills | Status: DC

## 2021-12-29 MED ORDER — WEGOVY 0.25 MG/0.5ML ~~LOC~~ SOAJ: 0.2500 mg | SUBCUTANEOUS | 0 refills | Status: DC

## 2021-12-29 NOTE — Telephone Encounter (Signed)
Prior Authorization for CPAP titration sent to [facility] via EMR Fax to [phone]

## 2021-12-30 ENCOUNTER — Encounter: Payer: Self-pay | Admitting: Family Medicine

## 2022-01-06 ENCOUNTER — Telehealth: Payer: Self-pay

## 2022-01-06 NOTE — Telephone Encounter (Signed)
PA forms for [facility] faxed to  [phone].  Awaiting repsonse.  Dm/cma

## 2022-01-07 ENCOUNTER — Encounter: Payer: Self-pay | Admitting: Cardiology

## 2022-01-07 ENCOUNTER — Telehealth: Payer: Self-pay | Admitting: *Deleted

## 2022-01-07 NOTE — Telephone Encounter (Signed)
Patient notified CPAP titration study has been denied by her [employer]. APAP will be ordered.

## 2022-01-07 NOTE — Telephone Encounter (Signed)
APAP order faxed via EMR to [facility].

## 2022-01-10 NOTE — Telephone Encounter (Signed)
PA for [facility] approved by [employer] from 01/08/22 - 01/09/23.  Pharmacy/patient notified.  Dm/cma

## 2022-02-28 ENCOUNTER — Ambulatory Visit: Payer: BC Managed Care – PPO | Admitting: Family Medicine

## 2022-03-07 ENCOUNTER — Ambulatory Visit: Payer: BC Managed Care – PPO | Admitting: Cardiology

## 2022-03-29 ENCOUNTER — Encounter: Payer: Self-pay | Admitting: Cardiology

## 2022-04-22 ENCOUNTER — Ambulatory Visit: Payer: BC Managed Care – PPO | Admitting: Family Medicine

## 2022-05-24 ENCOUNTER — Encounter: Payer: Self-pay | Admitting: Cardiology

## 2022-05-26 ENCOUNTER — Other Ambulatory Visit: Payer: Self-pay

## 2022-05-26 DIAGNOSIS — Z79899 Other long term (current) drug therapy: Secondary | ICD-10-CM

## 2022-05-26 NOTE — Progress Notes (Signed)
Orders placed for lab work per Dr. Terrial Rhodes message to pt. See chart.

## 2022-05-27 LAB — BASIC METABOLIC PANEL
BUN/Creatinine Ratio: 29 — ABNORMAL HIGH (ref 9–23)
BUN: 20 mg/dL (ref 6–24)
CO2: 23 mmol/L (ref 20–29)
Calcium: 9.5 mg/dL (ref 8.7–10.2)
Chloride: 103 mmol/L (ref 96–106)
Creatinine, Ser: 0.68 mg/dL (ref 0.57–1.00)
Glucose: 90 mg/dL (ref 70–99)
Potassium: 3.9 mmol/L (ref 3.5–5.2)
Sodium: 141 mmol/L (ref 134–144)
eGFR: 104 mL/min/{1.73_m2} (ref >59)

## 2022-05-27 LAB — MAGNESIUM: Magnesium: 2 mg/dL (ref 1.6–2.3)

## 2022-06-03 ENCOUNTER — Ambulatory Visit: Payer: BC Managed Care – PPO | Admitting: Family Medicine

## 2022-06-07 ENCOUNTER — Encounter: Payer: Self-pay | Admitting: Family Medicine

## 2022-07-04 ENCOUNTER — Ambulatory Visit: Payer: BC Managed Care – PPO | Admitting: Cardiology

## 2022-07-11 ENCOUNTER — Encounter: Payer: Self-pay | Admitting: Cardiology

## 2022-07-11 ENCOUNTER — Ambulatory Visit: Payer: BC Managed Care – PPO | Attending: Cardiology | Admitting: Cardiology

## 2022-07-11 VITALS — BP 146/90 | HR 84 | Ht 63.5 in | Wt 254.6 lb

## 2022-07-11 DIAGNOSIS — I10 Essential (primary) hypertension: Secondary | ICD-10-CM | POA: Diagnosis not present

## 2022-07-11 DIAGNOSIS — G4733 Obstructive sleep apnea (adult) (pediatric): Secondary | ICD-10-CM

## 2022-07-11 DIAGNOSIS — I493 Ventricular premature depolarization: Secondary | ICD-10-CM

## 2022-07-11 DIAGNOSIS — F4321 Adjustment disorder with depressed mood: Secondary | ICD-10-CM

## 2022-07-11 DIAGNOSIS — E781 Pure hyperglyceridemia: Secondary | ICD-10-CM

## 2022-07-11 NOTE — Patient Instructions (Signed)
Medication Instructions:  Your physician recommends that you continue on your current medications as directed. Please refer to the Current Medication list given to you today.  *If you need a refill on your cardiac medications before your next appointment, please call your pharmacy*   Lab Work: None   Testing/Procedures: None   Follow-Up: At [facility], you and your health needs are our priority.  As part of our continuing mission to provide you with exceptional heart care, we have created designated Provider Care Teams.  These Care Teams include your primary Cardiologist (physician) and Advanced Practice Providers (APPs -  Physician Assistants and Nurse Practitioners) who all work together to provide you with the care you need, when you need it.   Your next appointment:   1 year(s)  Provider:   Kardie Tobb, DO  

## 2022-07-12 NOTE — Progress Notes (Signed)
Cardiology Office Note:    Date:  07/12/2022   ID:  Kayla Combs, DOB 1971-03-11, MRN [id number]  PCP:  Haydee Salter, MD  Cardiologist:  None  Electrophysiologist:  None   Referring MD: Haydee Salter, MD   " I lost my dad recently"  History of Present Illness:    Kayla Combs is a 52 y.o. female with a hx of hypertension, anxiety, hyperlipidemia, obesity, OSA with cpap and frequent PVC here today for follow-up visit.    Since her last visit in 08/2021 she has been doing well with her current regimen but recently lost her dad.  She denies any chest pain but has had intermittent palpitations.  Past Medical History:  Diagnosis Date   Allergy    Anxiety    Arthritis    lower back    Depression    History of asthma as young adult   Hyperlipidemia    diet controlled   Hypertension    Migraine headache    PONV (postoperative nausea and vomiting)    Wears glasses     Past Surgical History:  Procedure Laterality Date   CESAREAN SECTION     x 3    DILITATION & CURRETTAGE/HYSTROSCOPY WITH HYDROTHERMAL ABLATION N/A 06/11/2020   Procedure: DILATATION & CURETTAGE/HYSTEROSCOPY WITH HYDROTHERMAL ABLATION, CERVICAL BIOPSY;  Surgeon: Linda Hedges, DO;  Location: [place];  Service: Gynecology;  Laterality: N/A;   DILITATION & CURRETTAGE/HYSTROSCOPY WITH HYDROTHERMAL ABLATION  06/05/2020   NAILBED REPAIR Right 10/16/2018   Procedure: BIOPSY RIGHT NAILBED MIDDLE FINGER;  Surgeon: Daryll Brod, MD;  Location: [place];  Service: Orthopedics;  Laterality: Right;  IV REGIONAL FOREARM BLOCK   TONSILLECTOMY AND ADENOIDECTOMY  as child age 57   WISDOM TOOTH EXTRACTION     1986-1987    Current Medications: Current Meds  Medication Sig   acebutolol (SECTRAL) 400 MG capsule Take 1 capsule (400 mg total) by mouth 2 (two) times daily.   ALPRAZolam (XANAX) 0.25 MG tablet Take 1 tablet (0.25 mg total) by mouth daily as needed for anxiety.    diltiazem (CARDIZEM CD) 240 MG 24 hr capsule Take 1 capsule (240 mg total) by mouth daily.   fluticasone (FLONASE) 50 MCG/ACT nasal spray Place 1 spray into both nostrils as needed for allergies.   ibuprofen (ADVIL) 200 MG tablet Take 200 mg by mouth every 6 (six) hours as needed.   metroNIDAZOLE (METROCREAM) 0.75 % cream as needed.   Multiple Vitamins-Minerals (CENTRUM SILVER PO) Take by mouth.   VITAMIN D PO Take 5,000 Units by mouth daily.   zolpidem (AMBIEN) 10 MG tablet Take 1 tablet (10 mg total) by mouth at bedtime as needed for sleep.     Allergies:   Aleve [naproxen sodium]   Social History   Socioeconomic History   Marital status: Married    Spouse name: Not on file   Number of children: 3   Years of education: Not on file   Highest education level: Bachelor's degree (e.g., BA, AB, BS)  Occupational History   Occupation: RN    Comment: [employer]  Tobacco Use   Smoking status: Former    Packs/day: 0.30    Years: 10.00    Additional pack years: 0.00    Total pack years: 3.00    Types: Cigarettes    Quit date: 03/26/2011    Years since quitting: 11.3   Smokeless tobacco: Never  Vaping Use   Vaping Use: Never used  Substance and Sexual Activity   Alcohol use: Yes    Alcohol/week: 0.0 - 4.0 standard drinks of alcohol    Comment: social   Drug use: No   Sexual activity: Not Currently    Birth control/protection: Surgical    Comment: husband vasectomy  Other Topics Concern   Not on file  Social History Narrative   Not on file   Social Determinants of Health   Financial Resource Strain: Not on file  Food Insecurity: Not on file  Transportation Needs: Not on file  Physical Activity: Not on file  Stress: Not on file  Social Connections: Not on file     Family History: The patient's family history includes Cervical cancer in her mother; Hypertension in her father and mother. There is no history of Colon cancer, Colon polyps, Esophageal cancer, Rectal  cancer, or Stomach cancer.  ROS:   Review of Systems  Constitution: Negative for decreased appetite, fever and weight gain.  HENT: Negative for congestion, ear discharge, hoarse voice and sore throat.   Eyes: Negative for discharge, redness, vision loss in right eye and visual halos.  Cardiovascular: Negative for chest pain, dyspnea on exertion, leg swelling, orthopnea and palpitations.  Respiratory: Negative for cough, hemoptysis, shortness of breath and snoring.   Endocrine: Negative for heat intolerance and polyphagia.  Hematologic/Lymphatic: Negative for bleeding problem. Does not bruise/bleed easily.  Skin: Negative for flushing, nail changes, rash and suspicious lesions.  Musculoskeletal: Negative for arthritis, joint pain, muscle cramps, myalgias, neck pain and stiffness.  Gastrointestinal: Negative for abdominal pain, bowel incontinence, diarrhea and excessive appetite.  Genitourinary: Negative for decreased libido, genital sores and incomplete emptying.  Neurological: Negative for brief paralysis, focal weakness, headaches and loss of balance.  Psychiatric/Behavioral: Negative for altered mental status, depression and suicidal ideas.  Allergic/Immunologic: Negative for HIV exposure and persistent infections.    EKGs/Labs/Other Studies Reviewed:    The following studies were reviewed today:   [id number] rhythm, HR 84 bpm  ZIO monitor July 05, 2021 Patch Wear Time:  14 days and 0 hours (2023-02-09T16:50:45-0500 to 2023-02-23T16:50:49-0500)   Patient had a min HR of 28 bpm, max HR of 166 bpm, and avg HR of 88 bpm. Predominant underlying rhythm was Sinus Rhythm.   1 run of Supraventricular Tachycardia occurred lasting 4 beats with a max rate of 152 bpm (avg 145 bpm).  Second Degree AV Block-Mobitz I (Wenckebach) was present on 06/11/2021 at 12:04 am.    Isolated SVEs were rare (<1.0%), SVE Couplets were rare (<1.0%), and SVE Triplets were rare (<1.0%). Isolated VEs were  occasional (3.4%, 60376), VE Couplets were rare (<1.0%, 322), and no VE Triplets were present.  Ventricular Bigeminy and Trigeminy were present.    Symptoms associated with sinus tachycardia.    The study is remarkable for the following:                        1. Rare paroxysmal supraventricular tachycardia                        2. Occasional Premature ventricular complexes                       3. Second Degree AV Block-Mobitz I (Wenckebach) was present on 06/11/2021 at 12:04 am  ( overnight hours raising suspicion for sleep apnea.  Transthoracic echocardiogram 06/08/2021 IMPRESSIONS    1. Left ventricular ejection fraction, by estimation, is 60  to 65%. The left ventricle has normal function. The left ventricle has no regional  wall motion abnormalities. Left ventricular diastolic parameters were normal.   2. Right ventricular systolic function is normal. The right ventricular  size is normal.   3. Left atrial size was moderately dilated.   4. Possible PFO.   5. The mitral valve is abnormal. Trivial mitral valve regurgitation. No  evidence of mitral stenosis.   6. The aortic valve is tricuspid. There is mild calcification of the  aortic valve. Aortic valve regurgitation is not visualized. Aortic valve  sclerosis is present, with no evidence of aortic valve stenosis.   7. The inferior vena cava is normal in size with greater than 50%  respiratory variability, suggesting right atrial pressure of 3 mmHg.   Comparison(s): EF 60 %.   FINDINGS   Left Ventricle: Left ventricular ejection fraction, by estimation, is 60  to 65%. The left ventricle has normal function. The left ventricle has no  regional wall motion abnormalities. Definity contrast agent was given IV  to delineate the left ventricular   endocardial borders. The left ventricular internal cavity size was normal  in size. There is no left ventricular hypertrophy. Left ventricular  diastolic parameters were normal.   Right  Ventricle: The right ventricular size is normal. No increase in  right ventricular wall thickness. Right ventricular systolic function is  normal.   Left Atrium: Left atrial size was moderately dilated.   Right Atrium: Right atrial size was normal in size.   Pericardium: There is no evidence of pericardial effusion.   Mitral Valve: The mitral valve is abnormal. There is mild thickening of  the mitral valve leaflet(s). Trivial mitral valve regurgitation. No  evidence of mitral valve stenosis.   Tricuspid Valve: The tricuspid valve is normal in structure. Tricuspid  valve regurgitation is trivial. No evidence of tricuspid stenosis.   Aortic Valve: The aortic valve is tricuspid. There is mild calcification  of the aortic valve. Aortic valve regurgitation is not visualized. Aortic  valve sclerosis is present, with no evidence of aortic valve stenosis.  Aortic valve mean gradient measures  6.0 mmHg. Aortic valve peak gradient measures 11.8 mmHg. Aortic valve  area, by VTI measures 1.92 cm.   Pulmonic Valve: The pulmonic valve was normal in structure. Pulmonic valve  regurgitation is not visualized. No evidence of pulmonic stenosis.   Aorta: The aortic root is normal in size and structure.   Venous: The inferior vena cava is normal in size with greater than 50%  respiratory variability, suggesting right atrial pressure of 3 mmHg.   IAS/Shunts: The interatrial septum was not well visualized.       Recent Labs: 05/26/2022: BUN 20; Creatinine, Ser 0.68; Magnesium 2.0; Potassium 3.9; Sodium 141  Recent Lipid Panel    Component Value Date/Time   CHOL 171 08/25/2021 1558   TRIG 226.0 (H) 08/25/2021 1558   HDL 36.20 (L) 08/25/2021 1558   CHOLHDL 5 08/25/2021 1558   VLDL 45.2 (H) 08/25/2021 1558   LDLCALC 105 (H) 04/07/2016 1042   LDLDIRECT 98.0 08/25/2021 1558    Physical Exam:    VS:  BP (!) 146/90   Pulse 84   Ht 5' 3.5" (1.613 m)   Wt 115.5 kg   SpO2 98%   BMI 44.39  kg/m     Wt Readings from Last 3 Encounters:  07/11/22 115.5 kg  12/28/21 115.1 kg  11/24/21 114.8 kg     GEN: Well nourished, well developed  in no acute distress HEENT: Normal NECK: No JVD; No carotid bruits LYMPHATICS: No lymphadenopathy CARDIAC: S1S2 noted,RRR, no murmurs, rubs, gallops RESPIRATORY:  Clear to auscultation without rales, wheezing or rhonchi  ABDOMEN: Soft, non-tender, non-distended, +bowel sounds, no guarding. EXTREMITIES: No edema, No cyanosis, no clubbing MUSCULOSKELETAL:  No deformity  SKIN: Warm and dry NEUROLOGIC:  Alert and oriented x 3, non-focal PSYCHIATRIC:  Normal affect, good insight  ASSESSMENT:    1. Frequent PVCs   2. OSA on CPAP   3. Morbid obesity ([place])   4. Essential hypertension   5. Grief   6. Hypertriglyceridemia    PLAN:    She has had some improvement on her current medication regimen for PVCs which includes acebutolol 400 mg twice a day, Cardizem to 40 mg daily.  Will send refills.  She is can need a repeat lipid profile to reassess her hypertriglyceridemia.  For now she is not on any medication.  The patient understands the need to lose weight with diet and exercise. We have discussed specific strategies for this.   Her blood pressure is elevated in the office, she notes that it is within target at home she will take her blood pressure daily send me the information in 2 weeks.  She would benefit from grief counseling for her loss of her dad.  The patient is in agreement with the above plan. The patient left the office in stable condition.  The patient will follow up in 6 months.   Medication Adjustments/Labs and Tests Ordered: Current medicines are reviewed at length with the patient today.  Concerns regarding medicines are outlined above.  Orders Placed This Encounter  Procedures   EKG 12-Lead   No orders of the defined types were placed in this encounter.   Patient Instructions  Medication Instructions:  Your  physician recommends that you continue on your current medications as directed. Please refer to the Current Medication list given to you today.  *If you need a refill on your cardiac medications before your next appointment, please call your pharmacy*   Lab Work: None   Testing/Procedures: None   Follow-Up: At [facility], you and your health needs are our priority.  As part of our continuing mission to provide you with exceptional heart care, we have created designated Provider Care Teams.  These Care Teams include your primary Cardiologist (physician) and Advanced Practice Providers (APPs -  Physician Assistants and Nurse Practitioners) who all work together to provide you with the care you need, when you need it.   Your next appointment:   1 year(s)  Provider:   Berniece Salines, DO     Adopting a Healthy Lifestyle.  Know what a healthy weight is for you (roughly BMI <25) and aim to maintain this   Aim for 7+ servings of fruits and vegetables daily   65-80+ fluid ounces of water or unsweet tea for healthy kidneys   Limit to max 1 drink of alcohol per day; avoid smoking/tobacco   Limit animal fats in diet for cholesterol and heart health - choose grass fed whenever available   Avoid highly processed foods, and foods high in saturated/trans fats   Aim for low stress - take time to unwind and care for your mental health   Aim for 150 min of moderate intensity exercise weekly for heart health, and weights twice weekly for bone health   Aim for 7-9 hours of sleep daily   When it comes to diets, agreement about the  perfect plan isnt easy to find, even among the experts. Experts at the [employer] developed an idea known as the Healthy Eating Plate. Just imagine a plate divided into logical, healthy portions.   The emphasis is on diet quality:   Load up on vegetables and fruits - one-half of your plate: Aim for color and variety, and remember that  potatoes dont count.   Go for whole grains - one-quarter of your plate: Whole wheat, barley, wheat berries, quinoa, oats, brown rice, and foods made with them. If you want pasta, go with whole wheat pasta.   Protein power - one-quarter of your plate: Fish, chicken, beans, and nuts are all healthy, versatile protein sources. Limit red meat.   The diet, however, does go beyond the plate, offering a few other suggestions.   Use healthy plant oils, such as olive, canola, soy, corn, sunflower and peanut. Check the labels, and avoid partially hydrogenated oil, which have unhealthy trans fats.   If youre thirsty, drink water. Coffee and tea are good in moderation, but skip sugary drinks and limit milk and dairy products to one or two daily servings.   The type of carbohydrate in the diet is more important than the amount. Some sources of carbohydrates, such as vegetables, fruits, whole grains, and beans-are healthier than others.   Finally, stay active  Signed, Berniece Salines, DO  07/12/2022 10:29 PM    [facility]

## 2022-10-16 ENCOUNTER — Other Ambulatory Visit: Payer: Self-pay | Admitting: Cardiology

## 2022-10-17 ENCOUNTER — Encounter: Payer: Self-pay | Admitting: Cardiology

## 2023-03-07 ENCOUNTER — Other Ambulatory Visit: Payer: Self-pay | Admitting: Family Medicine

## 2023-03-07 ENCOUNTER — Encounter: Payer: Self-pay | Admitting: Family Medicine

## 2023-03-07 NOTE — Telephone Encounter (Signed)
Spoke to patient and she states that she never started the [place].  Advised he that she is due for an OV and she will call the pharmacy to see if they will fill the first RX for the 0.25 mg dose. Dm/cma

## 2023-03-22 NOTE — Telephone Encounter (Signed)
[facility] from [place] is saying they never got the script for [facility] 0.25 MG/0.5ML SOAJ [[id number]]. It's also out of stock at their location.   He is wanting to know if we can send this to [facility] Address: [address] [place], [place], [place] [address] Hours:  Open ? Closes 8:30?PM   More hours Phone: [phone]

## 2023-03-27 MED ORDER — WEGOVY 0.25 MG/0.5ML ~~LOC~~ SOAJ: 0.2500 mg | SUBCUTANEOUS | 0 refills | Status: DC

## 2023-03-27 NOTE — Addendum Note (Signed)
Addended by: Mickle Plumb L on: 03/27/2023 11:40 AM   Modules accepted: Orders

## 2023-04-10 ENCOUNTER — Other Ambulatory Visit (HOSPITAL_COMMUNITY): Payer: Self-pay

## 2023-04-10 ENCOUNTER — Telehealth: Payer: Self-pay

## 2023-04-10 NOTE — Telephone Encounter (Signed)
Pharmacy Patient Advocate Encounter   Received notification from Pt Calls Messages that prior authorization for Wegovy 0.5mg /0.82ml is required/requested.   Insurance verification completed.   The patient is insured through [employer] [facility] .   Per test claim: PA required; PA started via CoverMyMeds. KEY [id number] . Waiting for clinical questions to populate.

## 2023-04-10 NOTE — Telephone Encounter (Signed)
See other message for PA information. Dm/cma

## 2023-04-10 NOTE — Telephone Encounter (Signed)
Can you do the PR for her Wegovy? Thanks. Dm/cma

## 2023-04-10 NOTE — Telephone Encounter (Signed)
 Clinical questions answered and PA submitted

## 2023-04-11 NOTE — Telephone Encounter (Signed)
Patient notified VIA phone. Dm/cma  

## 2023-04-11 NOTE — Telephone Encounter (Signed)
Pharmacy Patient Advocate Encounter  Received notification from [facility] that Prior Authorization for [facility] 0.25MG /0.5ML auto-injectors has been DENIED.  Full denial letter will be uploaded to the media tab. See denial reason below.   PA #/Case ID/Reference #: [id number]  Why your request was denied: *Drug Not Covered/Plan Exclusion - Your request for coverage was denied because your prescription benefit plan does not cover the requested medication. *Drug Not Covered/Plan Exclusion - Your request for coverage was denied because your prescription benefit plan does not cover the requested medication.

## 2023-05-03 ENCOUNTER — Other Ambulatory Visit (HOSPITAL_COMMUNITY): Payer: Self-pay

## 2023-08-25 ENCOUNTER — Other Ambulatory Visit: Payer: Self-pay | Admitting: Cardiology

## 2023-09-25 ENCOUNTER — Telehealth (INDEPENDENT_AMBULATORY_CARE_PROVIDER_SITE_OTHER): Admitting: Family Medicine

## 2023-09-25 VITALS — Temp 99.5°F | Ht 63.5 in

## 2023-09-25 DIAGNOSIS — U071 COVID-19: Secondary | ICD-10-CM | POA: Diagnosis not present

## 2023-09-25 MED ORDER — HYDROCODONE BIT-HOMATROP MBR 5-1.5 MG/5ML PO SOLN: 5.0000 mL | Freq: Three times a day (TID) | ORAL | 0 refills | Status: DC | PRN

## 2023-09-25 MED ORDER — NIRMATRELVIR/RITONAVIR (PAXLOVID)TABLET: 3.0000 | ORAL_TABLET | Freq: Two times a day (BID) | ORAL | 0 refills | Status: AC

## 2023-09-25 NOTE — Assessment & Plan Note (Signed)
 Reviewed home care instructions for COVID, including rest, pushing fluids, and OTC medications as needed for symptom relief. Recommend hot tea with honey for sore throat symptoms. I will prescribe Paxlovid. I will prescribe hydrocodone cough syrup. Advised self-isolation at home until 24 hours after fever resolved and symptoms improve. Continue to wear a mask around others for an additional 5 days. I recommend she obtain a pulse oximeter. If symptoms, esp, dyspnea develops/worsens or O2 level is consistently < 95%, recommend in-person evaluation at the emergency room.

## 2023-09-25 NOTE — Progress Notes (Signed)
 [facility] PRIMARY CARE [facility] [address] [place] [address] Dept: [phone] Dept Fax: [phone]  Virtual Video Visit  I connected with Kayla Combs on 09/25/23 at  1:40 PM EDT by a video enabled telemedicine application and verified that I am speaking with the correct person using two identifiers.  Location patient: Home Location provider: Clinic Persons participating in the virtual visit: Patient, her husband, and Provider  I discussed the limitations of evaluation and management by telemedicine and the availability of in person appointments. The patient expressed understanding and agreed to proceed.  Chief Complaint  Patient presents with   Cough    C/o having HA, ear pain, lost voice, and cough x 3 days.   Positive Covid test on Saturday.    SUBJECTIVE:  HPI: Kayla Combs is a 53 y.o. female who presents with a 3-day history of headache, bilateral ear pain, sore throat, hoarseness, and intermittent cough. She notes it has been difficult to swallow at times. For the past day, she espouses some difficulty breathing. She tested positive 2 days ago on a home COVID test (she ran three and all were positive).  Patient Active Problem List   Diagnosis Date Noted   Obstructive sleep apnea 08/31/2021   Frequent PVCs 08/23/2021   Rosacea 08/23/2021   Seasonal allergic rhinitis 08/23/2021   Degenerative lumbar spinal stenosis 06/26/2019   Disc displacement, lumbar 06/26/2019   Radiculopathy, lumbar region 05/13/2019   Vitamin D  deficiency 04/19/2018   Thyroid  nodule 01/21/2015   Hypocalcemia 01/21/2015   Leukocytosis 01/08/2015   Morbid obesity with BMI of 40.0-44.9, adult (HCC) 04/02/2013   Insomnia 11/27/2012   Hypertriglyceridemia 03/06/2012   Essential hypertension 03/06/2012   Generalized anxiety disorder 03/06/2012   Sciatica of left side 12/17/2008   Past Surgical History:  Procedure Laterality Date    CESAREAN SECTION     x 3    DILITATION & CURRETTAGE/HYSTROSCOPY WITH HYDROTHERMAL ABLATION N/A 06/11/2020   Procedure: DILATATION & CURETTAGE/HYSTEROSCOPY WITH HYDROTHERMAL ABLATION, CERVICAL BIOPSY;  Surgeon: Dyanna Glasgow, DO;  Location:  SURGERY CENTER;  Service: Gynecology;  Laterality: N/A;   DILITATION & CURRETTAGE/HYSTROSCOPY WITH HYDROTHERMAL ABLATION  06/05/2020   NAILBED REPAIR Right 10/16/2018   Procedure: BIOPSY RIGHT NAILBED MIDDLE FINGER;  Surgeon: Lyanne Sample, MD;  Location: [facility];  Service: Orthopedics;  Laterality: Right;  IV REGIONAL FOREARM BLOCK   TONSILLECTOMY AND ADENOIDECTOMY  as child age 30   WISDOM TOOTH EXTRACTION     1986-1987   Family History  Problem Relation Age of Onset   Hypertension Mother    Cervical cancer Mother    Hypertension Father    Colon cancer Neg Hx    Colon polyps Neg Hx    Esophageal cancer Neg Hx    Rectal cancer Neg Hx    Stomach cancer Neg Hx    Social History   Tobacco Use   Smoking status: Former    Current packs/day: 0.00    Average packs/day: 0.3 packs/day for 10.0 years (3.0 ttl pk-yrs)    Types: Cigarettes    Start date: 03/25/2001    Quit date: 03/26/2011    Years since quitting: 12.5   Smokeless tobacco: Never  Vaping Use   Vaping status: Never Used  Substance Use Topics   Alcohol use: Yes    Alcohol/week: 0.0 - 4.0 standard drinks of alcohol    Comment: social   Drug use: No    Current Outpatient Medications:    acebutolol  (  SECTRAL ) 400 MG capsule, Take 1 capsule (400 mg total) by mouth 2 (two) times daily., Disp: 60 capsule, Rfl: 0   ALPRAZolam  (XANAX ) 0.25 MG tablet, Take 1 tablet (0.25 mg total) by mouth daily as needed for anxiety., Disp: 90 tablet, Rfl: 0   diltiazem  (CARDIZEM  CD) 240 MG 24 hr capsule, TAKE 1 CAPSULE DAILY, Disp: 30 capsule, Rfl: 0   HYDROcodone bit-homatropine (HYCODAN) 5-1.5 MG/5ML syrup, Take 5 mLs by mouth every 8 (eight) hours as needed for cough., Disp: 120  mL, Rfl: 0   ibuprofen  (ADVIL ) 200 MG tablet, Take 200 mg by mouth every 6 (six) hours as needed., Disp: , Rfl:    metroNIDAZOLE (METROCREAM) 0.75 % cream, as needed., Disp: , Rfl:    Multiple Vitamins-Minerals (CENTRUM SILVER PO), Take by mouth., Disp: , Rfl:    nirmatrelvir/ritonavir (PAXLOVID) 20 x 150 MG & 10 x 100MG  TABS, Take 3 tablets by mouth 2 (two) times daily for 5 days. (Take nirmatrelvir 150 mg two tablets twice daily for 5 days and ritonavir 100 mg one tablet twice daily for 5 days) Patient GFR is 104, Disp: 30 tablet, Rfl: 0   VITAMIN D  PO, Take 5,000 Units by mouth daily., Disp: , Rfl:    zolpidem  (AMBIEN ) 10 MG tablet, Take 1 tablet (10 mg total) by mouth at bedtime as needed for sleep., Disp: 90 tablet, Rfl: 0 Allergies  Allergen Reactions   Aleve [Naproxen Sodium] Itching   ROS: See pertinent positives and negatives per HPI.  OBSERVATIONS/OBJECTIVE:  VITALS per patient if applicable: Today's Vitals   09/25/23 1326  Temp: 99.5 F (37.5 C)  TempSrc: Temporal  Height: 5' 3.5" (1.613 m)   Body mass index is 44.39 kg/m.   GENERAL: Alert and oriented. Appears ill and in mild emotional distress. HEENT: Atraumatic. Eyes clear. No obvious abnormalities on inspection of external nose and ears. NECK: Normal movements of the head and neck. Very hoarse. LUNGS: On inspection, no signs of respiratory distress. Breathing rate appears normal. No obvious gross SOB,   gasping or wheezing, and no conversational dyspnea. Some occasional coughing spells. CV: No obvious cyanosis. MS: Moves all visible extremities without noticeable abnormality. PSYCH/NEURO: Some tearfulness.  ASSESSMENT AND PLAN:  Problem List Items Addressed This Visit       Other   COVID-19 - Primary   Reviewed home care instructions for COVID, including rest, pushing fluids, and OTC medications as needed for symptom relief. Recommend hot tea with honey for sore throat symptoms. I will prescribe Paxlovid. I  will prescribe hydrocodone cough syrup. Advised self-isolation at home until 24 hours after fever resolved and symptoms improve. Continue to wear a mask around others for an additional 5 days. I recommend she obtain a pulse oximeter. If symptoms, esp, dyspnea develops/worsens or O2 level is consistently < 95%, recommend in-person evaluation at the emergency room.       Relevant Medications   HYDROcodone bit-homatropine (HYCODAN) 5-1.5 MG/5ML syrup   nirmatrelvir/ritonavir (PAXLOVID) 20 x 150 MG & 10 x 100MG  TABS     I discussed the assessment and treatment plan with the patient. The patient was provided an opportunity to ask questions and all were answered. The patient agreed with the plan and demonstrated an understanding of the instructions.   The patient was advised to call back or seek an in-person evaluation if the symptoms worsen or if the condition fails to improve as anticipated.  Return if symptoms worsen or fail to improve.   Graig Lawyer,  MD

## 2023-10-06 ENCOUNTER — Telehealth: Payer: Self-pay | Admitting: Cardiology

## 2023-10-06 MED ORDER — ACEBUTOLOL HCL 400 MG PO CAPS: 400.0000 mg | ORAL_CAPSULE | Freq: Two times a day (BID) | ORAL | 0 refills | Status: DC

## 2023-10-06 NOTE — Telephone Encounter (Signed)
*  STAT* If patient is at the pharmacy, call can be transferred to refill team.   1. Which medications need to be refilled? (please list name of each medication and dose if known)   acebutolol  (SECTRAL ) 400 MG capsule   2. Would you like to learn more about the convenience, safety, & potential cost savings by using the [facility]?   3. Are you open to using the [facility] (Type [facility]. ).  4. Which pharmacy/location (including street and city if local pharmacy) is medication to be sent to?  [facility] - [place], [place] - [address] [facility] AT Portal to Registered [facility] Sites   5. Do they need a 30 day or 90 day supply?   30 day  Caller Craige Dixon) stated patient is still has some medication. Ref# [id number].

## 2023-10-06 NOTE — Telephone Encounter (Signed)
 Pt's medication was sent to pt's pharmacy as requested. Confirmation received.

## 2023-11-03 ENCOUNTER — Telehealth: Payer: Self-pay | Admitting: Cardiology

## 2023-11-03 MED ORDER — DILTIAZEM HCL ER COATED BEADS 240 MG PO CP24: 240.0000 mg | ORAL_CAPSULE | Freq: Every day | ORAL | 0 refills | Status: DC

## 2023-11-03 MED ORDER — ACEBUTOLOL HCL 400 MG PO CAPS: 400.0000 mg | ORAL_CAPSULE | Freq: Two times a day (BID) | ORAL | 0 refills | Status: DC

## 2023-11-03 NOTE — Telephone Encounter (Signed)
*  STAT* If patient is at the pharmacy, call can be transferred to refill team.   1. Which medications need to be refilled? (please list name of each medication and dose if known) acebutolol  (SECTRAL ) 400 MG capsule  diltiazem  (CARDIZEM  CD) 240 MG 24 hr capsule    2. Would you like to learn more about the convenience, safety, & potential cost savings by using the [facility]?     3. Are you open to using the [facility] (Type [facility].  ).   4. Which pharmacy/location (including street and city if local pharmacy) is medication to be sent to? [facility] - [place], [place] - [facility] AT Portal to Registered [facility] Sites    5. Do they need a 30 day or 90 day supply? 90 day

## 2023-11-03 NOTE — Telephone Encounter (Signed)
 RX sent to requested Pharmacy

## 2023-11-11 LAB — HM MAMMOGRAPHY

## 2023-11-15 ENCOUNTER — Encounter: Payer: Self-pay | Admitting: Family Medicine

## 2024-01-01 ENCOUNTER — Ambulatory Visit (INDEPENDENT_AMBULATORY_CARE_PROVIDER_SITE_OTHER): Admitting: Student

## 2024-01-01 DIAGNOSIS — L237 Allergic contact dermatitis due to plants, except food: Secondary | ICD-10-CM

## 2024-01-01 MED ORDER — BETAMETHASONE DIPROPIONATE 0.05 % EX CREA: TOPICAL_CREAM | Freq: Two times a day (BID) | CUTANEOUS | 0 refills | Status: DC

## 2024-01-01 MED ORDER — PREDNISONE 50 MG PO TABS: 50.0000 mg | ORAL_TABLET | Freq: Every day | ORAL | 0 refills | Status: DC

## 2024-01-01 NOTE — Progress Notes (Signed)
 Patient is a 53 year old female who complains of poison oak.  She states that this started over the weekend on her arm, but has now spread to her buttocks and near her perineal area.  She states that she has been cleaning with Vashe and calamine lotion. She states itching is still persisting.  She does not report any infectious symptoms.  On exam, patient is in no acute distress.  Rash appears to be somewhat scaly in nature, no active blistering noted.  No signs of infection.  Will plan on sending in steroid cream and prednisone  if it continues. Encouraged patient to also follow up with her PCP.

## 2024-02-02 ENCOUNTER — Encounter: Payer: Self-pay | Admitting: *Deleted

## 2024-02-06 ENCOUNTER — Ambulatory Visit: Attending: Cardiology | Admitting: Cardiology

## 2024-02-06 ENCOUNTER — Ambulatory Visit (INDEPENDENT_AMBULATORY_CARE_PROVIDER_SITE_OTHER)

## 2024-02-06 ENCOUNTER — Encounter: Payer: Self-pay | Admitting: Cardiology

## 2024-02-06 VITALS — BP 140/98 | HR 77 | Ht 63.0 in | Wt 262.0 lb

## 2024-02-06 DIAGNOSIS — E781 Pure hyperglyceridemia: Secondary | ICD-10-CM

## 2024-02-06 DIAGNOSIS — Z79899 Other long term (current) drug therapy: Secondary | ICD-10-CM

## 2024-02-06 DIAGNOSIS — I493 Ventricular premature depolarization: Secondary | ICD-10-CM | POA: Diagnosis not present

## 2024-02-06 DIAGNOSIS — I10 Essential (primary) hypertension: Secondary | ICD-10-CM | POA: Diagnosis not present

## 2024-02-06 DIAGNOSIS — E559 Vitamin D deficiency, unspecified: Secondary | ICD-10-CM | POA: Diagnosis not present

## 2024-02-06 DIAGNOSIS — R002 Palpitations: Secondary | ICD-10-CM

## 2024-02-06 DIAGNOSIS — R600 Localized edema: Secondary | ICD-10-CM | POA: Diagnosis not present

## 2024-02-06 NOTE — Patient Instructions (Addendum)
 Medication Instructions:  Your physician recommends that you continue on your current medications as directed. Please refer to the Current Medication list given to you today.  *If you need a refill on your cardiac medications before your next appointment, please call your pharmacy*  Lab Work: CMET, Mag, Lipids, Lp(a), BNP, TSH, Vit D If you have labs (blood work) drawn today and your tests are completely normal, you will receive your results only by: MyChart Message (if you have MyChart) OR A paper copy in the mail If you have any lab test that is abnormal or we need to change your treatment, we will call you to review the results.  Testing/Procedures: Kayla Combs- Long Term Monitor Instructions  Your physician has requested you wear a ZIO patch monitor for 14 days.  This is a single patch monitor. [employer] supplies one patch monitor per enrollment. Additional stickers are not available. Please do not apply patch if you will be having a Nuclear Stress Test,  Echocardiogram, Cardiac CT, MRI, or Chest Xray during the period you would be wearing the  monitor. The patch cannot be worn during these tests. You cannot remove and re-apply the  ZIO XT patch monitor.  Your ZIO patch monitor will be mailed 3 day USPS to your address on file. It may take 3-5 days  to receive your monitor after you have been enrolled.  Once you have received your monitor, please review the enclosed instructions. Your monitor  has already been registered assigning a specific monitor serial # to you.  Billing and Patient Assistance Program Information  We have supplied [employer] with any of your insurance information on file for billing purposes. [employer] offers a sliding scale Patient Assistance Program for patients that do not have  insurance, or whose insurance does not completely cover the cost of the ZIO monitor.  You must apply for the Patient Assistance Program to qualify for this discounted rate.  To apply, please  call [employer] at [phone], select option 4, select option 2, ask to apply for  Patient Assistance Program. Meredeth will ask your household income, and how many people  are in your household. They will quote your out-of-pocket cost based on that information.  [employer] will also be able to set up a 50-month, interest-free payment plan if needed.  Applying the monitor   Shave hair from upper left chest.  Hold abrader disc by orange tab. Rub abrader in 40 strokes over the upper left chest as  indicated in your monitor instructions.  Clean area with 4 enclosed alcohol pads. Let dry.  Apply patch as indicated in monitor instructions. Patch will be placed under collarbone on left  side of chest with arrow pointing upward.  Rub patch adhesive wings for 2 minutes. Remove white label marked 1. Remove the white  label marked 2. Rub patch adhesive wings for 2 additional minutes.  While looking in a mirror, press and release button in center of patch. A small green light will  flash 3-4 times. This will be your only indicator that the monitor has been turned on.  Do not shower for the first 24 hours. You may shower after the first 24 hours.  Press the button if you feel a symptom. You will hear a small click. Record Date, Time and  Symptom in the Patient Logbook.  When you are ready to remove the patch, follow instructions on the last 2 pages of Patient  Logbook. Stick patch monitor onto the last page of Patient Logbook.  Place Patient Logbook in the blue and white box. Use locking tab on box and tape box closed  securely. The blue and white box has prepaid postage on it. Please place it in the mailbox as  soon as possible. Your physician should have your test results approximately 7 days after the  monitor has been mailed back to [facility].  Call [facility] Customer Care at [phone] if you have questions regarding  your ZIO XT patch monitor. Call them immediately if you see an  orange light blinking on your  monitor.  If your monitor falls off in less than 4 days, contact our Monitor department at [phone].  If your monitor becomes loose or falls off after 4 days call [employer] at [phone] for  suggestions on securing your monitor  Your physician has requested that you have an ankle brachial index (ABI). During this test an ultrasound and blood pressure cuff are used to evaluate the arteries that supply the arms and legs with blood. Allow thirty minutes for this exam. There are no restrictions or special instructions.  Your physician has requested that you have a lower extremity arterial duplex. This test is an ultrasound of the arteries in the legs. It looks at arterial blood flow in the legs. Allow one hour for Lower Arterial scans. There are no restrictions or special instructions.  Please note: We ask at that you not bring children with you during ultrasound (echo/ vascular) testing. Due to room size and safety concerns, children are not allowed in the ultrasound rooms during exams. Our front office staff cannot provide observation of children in our lobby area while testing is being conducted. An adult accompanying a patient to their appointment will only be allowed in the ultrasound room at the discretion of the ultrasound technician under special circumstances. We apologize for any inconvenience.   Follow-Up: At [facility], you and your health needs are our priority.  As part of our continuing mission to provide you with exceptional heart care, our providers are all part of one team.  This team includes your primary Cardiologist (physician) and Advanced Practice Providers or APPs (Physician Assistants and Nurse Practitioners) who all work together to provide you with the care you need, when you need it.  Your next appointment:   6 month(s)  Provider:   Kardie Tobb, DO

## 2024-02-06 NOTE — Progress Notes (Unsigned)
 Enrolled for [employer] to mail a ZIO XT long term holter monitor to the patients address on file.

## 2024-02-06 NOTE — Progress Notes (Signed)
 Cardiology Office Note:    Date:  02/10/2024   ID:  Kayla Combs, DOB 1970-05-13, MRN [id number]  PCP:  Thedora Garnette HERO, MD  Cardiologist:  Townsend Cudworth, DO  Electrophysiologist:  None   Referring MD: Thedora Garnette HERO, MD    I am still experiencing symptoms   History of Present Illness:    Kayla Combs is a 53 y.o. female with a hx of hypertension, anxiety, hyperlipidemia, obesity, OSA with cpap and frequent PVC here today for follow-up visit.    At her last visit she had lost her dad, and is currently working to have her mom immigrated with her to the [place] .  She shares with me that she is experiencing more symptoms from her PVCs she feels like every fifth beat is a PVC.  She is concerned about this because she feels significantly symptomatic.  Past Medical History:  Diagnosis Date   Allergy     Anxiety    Arthritis    lower back    Depression    History of asthma as young adult   Hyperlipidemia    diet controlled   Hypertension    Migraine headache    PONV (postoperative nausea and vomiting)    Wears glasses     Past Surgical History:  Procedure Laterality Date   CESAREAN SECTION     x 3    DILITATION & CURRETTAGE/HYSTROSCOPY WITH HYDROTHERMAL ABLATION N/A 06/11/2020   Procedure: DILATATION & CURETTAGE/HYSTEROSCOPY WITH HYDROTHERMAL ABLATION, CERVICAL BIOPSY;  Surgeon: Dannielle Bouchard, DO;  Location: [facility];  Service: Gynecology;  Laterality: N/A;   DILITATION & CURRETTAGE/HYSTROSCOPY WITH HYDROTHERMAL ABLATION  06/05/2020   NAILBED REPAIR Right 10/16/2018   Procedure: BIOPSY RIGHT NAILBED MIDDLE FINGER;  Surgeon: Murrell Kuba, MD;  Location: [facility];  Service: Orthopedics;  Laterality: Right;  IV REGIONAL FOREARM BLOCK   TONSILLECTOMY AND ADENOIDECTOMY  as child age 67   WISDOM TOOTH EXTRACTION     1986-1987    Current Medications: Current Meds  Medication Sig   acebutolol  (SECTRAL ) 400 MG capsule Take 1 capsule  (400 mg total) by mouth 2 (two) times daily.   ALPRAZolam  (XANAX ) 0.25 MG tablet Take 1 tablet (0.25 mg total) by mouth daily as needed for anxiety.   diltiazem  (CARDIZEM  CD) 240 MG 24 hr capsule Take 1 capsule (240 mg total) by mouth daily.   ibuprofen  (ADVIL ) 200 MG tablet Take 200 mg by mouth every 6 (six) hours as needed.   metroNIDAZOLE (METROCREAM) 0.75 % cream as needed.   Multiple Vitamins-Minerals (CENTRUM SILVER PO) Take by mouth.   VITAMIN D  PO Take 5,000 Units by mouth daily.   zolpidem  (AMBIEN ) 10 MG tablet Take 1 tablet (10 mg total) by mouth at bedtime as needed for sleep.     Allergies:   Aleve [naproxen sodium]   Social History   Socioeconomic History   Marital status: Married    Spouse name: Not on file   Number of children: 3   Years of education: Not on file   Highest education level: Bachelor's degree (e.g., BA, AB, BS)  Occupational History   Occupation: RN    Comment: [employer]  Tobacco Use   Smoking status: Former    Current packs/day: 0.00    Average packs/day: 0.3 packs/day for 10.0 years (3.0 ttl pk-yrs)    Types: Cigarettes    Start date: 03/25/2001    Quit date: 03/26/2011    Years since quitting: 12.8   Smokeless  tobacco: Never  Vaping Use   Vaping status: Never Used  Substance and Sexual Activity   Alcohol use: Yes    Alcohol/week: 0.0 - 4.0 standard drinks of alcohol    Comment: social   Drug use: No   Sexual activity: Not Currently    Birth control/protection: Surgical    Comment: husband vasectomy  Other Topics Concern   Not on file  Social History Narrative   Not on file   Social Drivers of Health   Financial Resource Strain: Low Risk  (09/25/2023)   Overall Financial Resource Strain (CARDIA)    Difficulty of Paying Living Expenses: Not very hard  Food Insecurity: No Food Insecurity (09/25/2023)   Hunger Vital Sign    Worried About Running Out of Food in the Last Year: Never true    Ran Out of Food in the Last Year: Never true   Transportation Needs: No Transportation Needs (09/25/2023)   PRAPARE - Administrator, Civil Service (Medical): No    Lack of Transportation (Non-Medical): No  Physical Activity: Unknown (09/25/2023)   Exercise Vital Sign    Days of Exercise per Week: 0 days    Minutes of Exercise per Session: Not on file  Stress: Stress Concern Present (09/25/2023)   [employer] of Occupational Health - Occupational Stress Questionnaire    Feeling of Stress : To some extent  Social Connections: Moderately Isolated (09/25/2023)   Social Connection and Isolation Panel    Frequency of Communication with Friends and Family: More than three times a week    Frequency of Social Gatherings with Friends and Family: More than three times a week    Attends Religious Services: Never    Database administrator or Organizations: No    Attends Engineer, structural: Not on file    Marital Status: Married     Family History: The patient's family history includes Cervical cancer in her mother; Hypertension in her father and mother. There is no history of Colon cancer, Colon polyps, Esophageal cancer, Rectal cancer, or Stomach cancer.  ROS:   Review of Systems  Constitution: Negative for decreased appetite, fever and weight gain.  HENT: Negative for congestion, ear discharge, hoarse voice and sore throat.   Eyes: Negative for discharge, redness, vision loss in right eye and visual halos.  Cardiovascular: Negative for chest pain, dyspnea on exertion, leg swelling, orthopnea and palpitations.  Respiratory: Negative for cough, hemoptysis, shortness of breath and snoring.   Endocrine: Negative for heat intolerance and polyphagia.  Hematologic/Lymphatic: Negative for bleeding problem. Does not bruise/bleed easily.  Skin: Negative for flushing, nail changes, rash and suspicious lesions.  Musculoskeletal: Negative for arthritis, joint pain, muscle cramps, myalgias, neck pain and stiffness.   Gastrointestinal: Negative for abdominal pain, bowel incontinence, diarrhea and excessive appetite.  Genitourinary: Negative for decreased libido, genital sores and incomplete emptying.  Neurological: Negative for brief paralysis, focal weakness, headaches and loss of balance.  Psychiatric/Behavioral: Negative for altered mental status, depression and suicidal ideas.  Allergic/Immunologic: Negative for HIV exposure and persistent infections.    EKGs/Labs/Other Studies Reviewed:    The following studies were reviewed today:   ZXH:Dpwld rhythm, HR 77 bpm   ZIO monitor July 05, 2021 Patch Wear Time:  14 days and 0 hours (2023-02-09T16:50:45-0500 to 2023-02-23T16:50:49-0500)   Patient had a min HR of 28 bpm, max HR of 166 bpm, and avg HR of 88 bpm. Predominant underlying rhythm was Sinus Rhythm.   1 run of Supraventricular  Tachycardia occurred lasting 4 beats with a max rate of 152 bpm (avg 145 bpm).  Second Degree AV Block-Mobitz I (Wenckebach) was present on 06/11/2021 at 12:04 am.    Isolated SVEs were rare (<1.0%), SVE Couplets were rare (<1.0%), and SVE Triplets were rare (<1.0%). Isolated VEs were occasional (3.4%, 60376), VE Couplets were rare (<1.0%, 322), and no VE Triplets were present.  Ventricular Bigeminy and Trigeminy were present.    Symptoms associated with sinus tachycardia.    The study is remarkable for the following:                        1. Rare paroxysmal supraventricular tachycardia                        2. Occasional Premature ventricular complexes                       3. Second Degree AV Block-Mobitz I (Wenckebach) was present on 06/11/2021 at 12:04 am  ( overnight hours raising suspicion for sleep apnea.  Transthoracic echocardiogram 06/08/2021 IMPRESSIONS    1. Left ventricular ejection fraction, by estimation, is 60 to 65%. The left ventricle has normal function. The left ventricle has no regional  wall motion abnormalities. Left ventricular diastolic  parameters were normal.   2. Right ventricular systolic function is normal. The right ventricular  size is normal.   3. Left atrial size was moderately dilated.   4. Possible PFO.   5. The mitral valve is abnormal. Trivial mitral valve regurgitation. No  evidence of mitral stenosis.   6. The aortic valve is tricuspid. There is mild calcification of the  aortic valve. Aortic valve regurgitation is not visualized. Aortic valve  sclerosis is present, with no evidence of aortic valve stenosis.   7. The inferior vena cava is normal in size with greater than 50%  respiratory variability, suggesting right atrial pressure of 3 mmHg.   Comparison(s): EF 60 %.   FINDINGS   Left Ventricle: Left ventricular ejection fraction, by estimation, is 60  to 65%. The left ventricle has normal function. The left ventricle has no  regional wall motion abnormalities. Definity  contrast agent was given IV  to delineate the left ventricular   endocardial borders. The left ventricular internal cavity size was normal  in size. There is no left ventricular hypertrophy. Left ventricular  diastolic parameters were normal.   Right Ventricle: The right ventricular size is normal. No increase in  right ventricular wall thickness. Right ventricular systolic function is  normal.   Left Atrium: Left atrial size was moderately dilated.   Right Atrium: Right atrial size was normal in size.   Pericardium: There is no evidence of pericardial effusion.   Mitral Valve: The mitral valve is abnormal. There is mild thickening of  the mitral valve leaflet(s). Trivial mitral valve regurgitation. No  evidence of mitral valve stenosis.   Tricuspid Valve: The tricuspid valve is normal in structure. Tricuspid  valve regurgitation is trivial. No evidence of tricuspid stenosis.   Aortic Valve: The aortic valve is tricuspid. There is mild calcification  of the aortic valve. Aortic valve regurgitation is not visualized. Aortic   valve sclerosis is present, with no evidence of aortic valve stenosis.  Aortic valve mean gradient measures  6.0 mmHg. Aortic valve peak gradient measures 11.8 mmHg. Aortic valve  area, by VTI measures 1.92 cm.   Pulmonic Valve: The pulmonic  valve was normal in structure. Pulmonic valve  regurgitation is not visualized. No evidence of pulmonic stenosis.   Aorta: The aortic root is normal in size and structure.   Venous: The inferior vena cava is normal in size with greater than 50%  respiratory variability, suggesting right atrial pressure of 3 mmHg.   IAS/Shunts: The interatrial septum was not well visualized.       Recent Labs: 02/07/2024: ALT 35; BNP 12.6; BUN 15; Creatinine, Ser 0.63; Magnesium  1.6; Potassium 3.6; Sodium 139; TSH 1.440  Recent Lipid Panel    Component Value Date/Time   CHOL 173 02/07/2024 1602   TRIG 174 (H) 02/07/2024 1602   HDL 39 (L) 02/07/2024 1602   CHOLHDL 4.4 02/07/2024 1602   CHOLHDL 5 08/25/2021 1558   VLDL 45.2 (H) 08/25/2021 1558   LDLCALC 104 (H) 02/07/2024 1602   LDLDIRECT 98.0 08/25/2021 1558    Physical Exam:    VS:  BP (!) 140/98 (BP Location: Left Arm, Patient Position: Sitting, Cuff Size: Large)   Pulse 77   Ht 5' 3 (1.6 m)   Wt 262 lb (118.8 kg)   SpO2 97%   BMI 46.41 kg/m     Wt Readings from Last 3 Encounters:  02/06/24 262 lb (118.8 kg)  07/11/22 254 lb 9.6 oz (115.5 kg)  12/28/21 253 lb 12.8 oz (115.1 kg)     GEN: Well nourished, well developed in no acute distress HEENT: Normal NECK: No JVD; No carotid bruits LYMPHATICS: No lymphadenopathy CARDIAC: S1S2 noted,RRR, no murmurs, rubs, gallops RESPIRATORY:  Clear to auscultation without rales, wheezing or rhonchi  ABDOMEN: Soft, non-tender, non-distended, +bowel sounds, no guarding. EXTREMITIES: No edema, No cyanosis, no clubbing MUSCULOSKELETAL:  No deformity  SKIN: Warm and dry NEUROLOGIC:  Alert and oriented x 3, non-focal PSYCHIATRIC:  Normal affect, good  insight  ASSESSMENT:    1. Essential hypertension   2. Leg edema   3. PVC (premature ventricular contraction)   4. Vitamin D  deficiency   5. Palpitations   6. Hypertriglyceridemia   7. Medication management    PLAN:    She still is experiencing increasing PVCs she is concerned about this.  I am going to repeat her monitor to understand her PVC burden.    She is currently on acebutolol  and Cardizem .  Will consider use of flecainide if frequent and also refer her to EP  Noted leg swelling mainly in the right lower extremity will get an ultrasound duplex to make sure that we are able to rule out DVT here.  Her blood pressure is elevated in the office but she tells me that is at target with systolic averaging between the 116 to 120 at home.  So we will hold off on adding or increasing antihypertensives at this time.  The patient understands the need to lose weight with diet and exercise. We have discussed specific strategies for this.  The patient is in agreement with the above plan. The patient left the office in stable condition.  The patient will follow up in 6 months.   Medication Adjustments/Labs and Tests Ordered: Current medicines are reviewed at length with the patient today.  Concerns regarding medicines are outlined above.  Orders Placed This Encounter  Procedures   Comp Met (CMET)   Magnesium    Lipid panel   Lipoprotein A (LPA)   B Nat Peptide   TSH+T4F+T3Free   VITAMIN D  25 Hydroxy (Vit-D Deficiency, Fractures)   LONG TERM MONITOR (3-14 DAYS)   EKG 12-Lead  VAS US  LOWER EXTREMITY ARTERIAL DUPLEX   VAS US  ABI WITH/WO TBI   VAS US  LOWER EXTREMITY VENOUS (DVT)   No orders of the defined types were placed in this encounter.   Patient Instructions  Medication Instructions:  Your physician recommends that you continue on your current medications as directed. Please refer to the Current Medication list given to you today.  *If you need a refill on your cardiac  medications before your next appointment, please call your pharmacy*  Lab Work: CMET, Mag, Lipids, Lp(a), BNP, TSH, Vit D If you have labs (blood work) drawn today and your tests are completely normal, you will receive your results only by: MyChart Message (if you have MyChart) OR A paper copy in the mail If you have any lab test that is abnormal or we need to change your treatment, we will call you to review the results.  Testing/Procedures: GEOFFRY HEWS- Long Term Monitor Instructions  Your physician has requested you wear a ZIO patch monitor for 14 days.  This is a single patch monitor. [employer] supplies one patch monitor per enrollment. Additional stickers are not available. Please do not apply patch if you will be having a Nuclear Stress Test,  Echocardiogram, Cardiac CT, MRI, or Chest Xray during the period you would be wearing the  monitor. The patch cannot be worn during these tests. You cannot remove and re-apply the  ZIO XT patch monitor.  Your ZIO patch monitor will be mailed 3 day USPS to your address on file. It may take 3-5 days  to receive your monitor after you have been enrolled.  Once you have received your monitor, please review the enclosed instructions. Your monitor  has already been registered assigning a specific monitor serial # to you.  Billing and Patient Assistance Program Information  We have supplied [employer] with any of your insurance information on file for billing purposes. [employer] offers a sliding scale Patient Assistance Program for patients that do not have  insurance, or whose insurance does not completely cover the cost of the ZIO monitor.  You must apply for the Patient Assistance Program to qualify for this discounted rate.  To apply, please call [employer] at [phone], select option 4, select option 2, ask to apply for  Patient Assistance Program. Meredeth will ask your household income, and how many people  are in your household. They will quote your  out-of-pocket cost based on that information.  [employer] will also be able to set up a 61-month, interest-free payment plan if needed.  Applying the monitor   Shave hair from upper left chest.  Hold abrader disc by orange tab. Rub abrader in 40 strokes over the upper left chest as  indicated in your monitor instructions.  Clean area with 4 enclosed alcohol pads. Let dry.  Apply patch as indicated in monitor instructions. Patch will be placed under collarbone on left  side of chest with arrow pointing upward.  Rub patch adhesive wings for 2 minutes. Remove white label marked 1. Remove the white  label marked 2. Rub patch adhesive wings for 2 additional minutes.  While looking in a mirror, press and release button in center of patch. A small green light will  flash 3-4 times. This will be your only indicator that the monitor has been turned on.  Do not shower for the first 24 hours. You may shower after the first 24 hours.  Press the button if you feel a symptom. You will hear a small click.  Record Date, Time and  Symptom in the Patient Logbook.  When you are ready to remove the patch, follow instructions on the last 2 pages of Patient  Logbook. Stick patch monitor onto the last page of Patient Logbook.  Place Patient Logbook in the blue and white box. Use locking tab on box and tape box closed  securely. The blue and white box has prepaid postage on it. Please place it in the mailbox as  soon as possible. Your physician should have your test results approximately 7 days after the  monitor has been mailed back to [facility].  Call [facility] Customer Care at [phone] if you have questions regarding  your ZIO XT patch monitor. Call them immediately if you see an orange light blinking on your  monitor.  If your monitor falls off in less than 4 days, contact our Monitor department at [phone].  If your monitor becomes loose or falls off after 4 days call [employer] at  [phone] for  suggestions on securing your monitor  Your physician has requested that you have a lower extremity venous duplex. This test is an ultrasound of the veins in the legs. It looks at venous blood flow that carries blood from the heart to the legs. Allow one hour for a Lower Venous exam. There are no restrictions or special instructions.  Please note: We ask at that you not bring children with you during ultrasound (echo/ vascular) testing. Due to room size and safety concerns, children are not allowed in the ultrasound rooms during exams. Our front office staff cannot provide observation of children in our lobby area while testing is being conducted. An adult accompanying a patient to their appointment will only be allowed in the ultrasound room at the discretion of the ultrasound technician under special circumstances. We apologize for any inconvenience.    Follow-Up: At [facility], you and your health needs are our priority.  As part of our continuing mission to provide you with exceptional heart care, our providers are all part of one team.  This team includes your primary Cardiologist (physician) and Advanced Practice Providers or APPs (Physician Assistants and Nurse Practitioners) who all work together to provide you with the care you need, when you need it.  Your next appointment:   6 month(s)  Provider:   Jiraiya Mcewan, DO             Adopting a Healthy Lifestyle.  Know what a healthy weight is for you (roughly BMI <25) and aim to maintain this   Aim for 7+ servings of fruits and vegetables daily   65-80+ fluid ounces of water or unsweet tea for healthy kidneys   Limit to max 1 drink of alcohol per day; avoid smoking/tobacco   Limit animal fats in diet for cholesterol and heart health - choose grass fed whenever available   Avoid highly processed foods, and foods high in saturated/trans fats   Aim for low stress - take time to unwind and care for  your mental health   Aim for 150 min of moderate intensity exercise weekly for heart health, and weights twice weekly for bone health   Aim for 7-9 hours of sleep daily   When it comes to diets, agreement about the perfect plan isnt easy to find, even among the experts. Experts at the [facility] [employer] developed an idea known as the Healthy Eating Plate. Just imagine a plate divided into logical, healthy portions.   The emphasis  is on diet quality:   Load up on vegetables and fruits - one-half of your plate: Aim for color and variety, and remember that potatoes dont count.   Go for whole grains - one-quarter of your plate: Whole wheat, barley, wheat berries, quinoa, oats, brown rice, and foods made with them. If you want pasta, go with whole wheat pasta.   Protein power - one-quarter of your plate: Fish, chicken, beans, and nuts are all healthy, versatile protein sources. Limit red meat.   The diet, however, does go beyond the plate, offering a few other suggestions.   Use healthy plant oils, such as olive, canola, soy, corn, sunflower and peanut. Check the labels, and avoid partially hydrogenated oil, which have unhealthy trans fats.   If youre thirsty, drink water. Coffee and tea are good in moderation, but skip sugary drinks and limit milk and dairy products to one or two daily servings.   The type of carbohydrate in the diet is more important than the amount. Some sources of carbohydrates, such as vegetables, fruits, whole grains, and beans-are healthier than others.   Finally, stay active  Signed, Sarann Tregre, DO  02/10/2024 9:03 PM    [facility]

## 2024-02-07 ENCOUNTER — Encounter: Payer: Self-pay | Admitting: Cardiology

## 2024-02-08 ENCOUNTER — Ambulatory Visit (HOSPITAL_COMMUNITY)
Admission: RE | Admit: 2024-02-08 | Discharge: 2024-02-08 | Disposition: A | Discharge status: Home / Self Care | Source: Ambulatory Visit | Attending: Cardiology | Admitting: Cardiology

## 2024-02-08 DIAGNOSIS — R600 Localized edema: Secondary | ICD-10-CM | POA: Insufficient documentation

## 2024-02-08 LAB — COMPREHENSIVE METABOLIC PANEL WITH GFR
ALT: 35 IU/L — ABNORMAL HIGH (ref 0–32)
AST: 29 IU/L (ref 0–40)
Albumin: 4.6 g/dL (ref 3.8–4.9)
Alkaline Phosphatase: 83 IU/L (ref 49–135)
BUN/Creatinine Ratio: 24 — ABNORMAL HIGH (ref 9–23)
BUN: 15 mg/dL (ref 6–24)
Bilirubin Total: 0.9 mg/dL (ref 0.0–1.2)
CO2: 20 mmol/L (ref 20–29)
Calcium: 9.5 mg/dL (ref 8.7–10.2)
Chloride: 101 mmol/L (ref 96–106)
Creatinine, Ser: 0.63 mg/dL (ref 0.57–1.00)
Globulin, Total: 2.5 g/dL (ref 1.5–4.5)
Glucose: 98 mg/dL (ref 70–99)
Potassium: 3.6 mmol/L (ref 3.5–5.2)
Sodium: 139 mmol/L (ref 134–144)
Total Protein: 7.1 g/dL (ref 6.0–8.5)
eGFR: 105 mL/min/1.73 (ref >59)

## 2024-02-08 LAB — TSH+T4F+T3FREE
Free T4: 1.46 ng/dL (ref 0.82–1.77)
T3, Free: 3.7 pg/mL (ref 2.0–4.4)
TSH: 1.44 u[IU]/mL (ref 0.450–4.500)

## 2024-02-08 LAB — LIPID PANEL
Chol/HDL Ratio: 4.4 ratio (ref 0.0–4.4)
Cholesterol, Total: 173 mg/dL (ref 100–199)
HDL: 39 mg/dL — ABNORMAL LOW (ref >39)
LDL Chol Calc (NIH): 104 mg/dL — ABNORMAL HIGH (ref 0–99)
Triglycerides: 174 mg/dL — ABNORMAL HIGH (ref 0–149)
VLDL Cholesterol Cal: 30 mg/dL (ref 5–40)

## 2024-02-08 LAB — MAGNESIUM: Magnesium: 1.6 mg/dL (ref 1.6–2.3)

## 2024-02-08 LAB — LIPOPROTEIN A (LPA): Lipoprotein (a): 59.6 nmol/L (ref <75.0)

## 2024-02-08 LAB — VITAMIN D 25 HYDROXY (VIT D DEFICIENCY, FRACTURES): Vit D, 25-Hydroxy: 61.8 ng/mL (ref 30.0–100.0)

## 2024-02-08 LAB — BRAIN NATRIURETIC PEPTIDE: BNP: 12.6 pg/mL (ref 0.0–100.0)

## 2024-02-13 ENCOUNTER — Ambulatory Visit: Payer: Self-pay | Admitting: Cardiology

## 2024-02-13 DIAGNOSIS — E782 Mixed hyperlipidemia: Secondary | ICD-10-CM

## 2024-02-21 ENCOUNTER — Ambulatory Visit (HOSPITAL_COMMUNITY)

## 2024-03-05 ENCOUNTER — Ambulatory Visit (INDEPENDENT_AMBULATORY_CARE_PROVIDER_SITE_OTHER): Admitting: Family Medicine

## 2024-03-05 ENCOUNTER — Encounter: Payer: Self-pay | Admitting: Family Medicine

## 2024-03-05 VITALS — BP 126/74 | HR 81 | Temp 98.4°F | Ht 63.0 in | Wt 252.4 lb

## 2024-03-05 DIAGNOSIS — Z6841 Body Mass Index (BMI) 40.0 and over, adult: Secondary | ICD-10-CM | POA: Diagnosis not present

## 2024-03-05 DIAGNOSIS — I10 Essential (primary) hypertension: Secondary | ICD-10-CM | POA: Diagnosis not present

## 2024-03-05 DIAGNOSIS — E781 Pure hyperglyceridemia: Secondary | ICD-10-CM

## 2024-03-05 MED ORDER — WEGOVY 0.5 MG/0.5ML ~~LOC~~ SOAJ: 0.5000 mg | SUBCUTANEOUS | 0 refills | Status: DC

## 2024-03-05 MED ORDER — WEGOVY 1 MG/0.5ML ~~LOC~~ SOAJ: 1.0000 mg | SUBCUTANEOUS | 0 refills | Status: DC

## 2024-03-05 NOTE — Progress Notes (Signed)
 [facility] [address] [place] [address] Dept: [phone] Dept Fax: [phone]  Office Visit  Subjective:    Patient ID: Kayla Combs, female    DOB: 11-Jul-1970, 53 y.o..   MRN: [id number]  Chief Complaint  Patient presents with   Follow-up    F/u weight meds.     History of Present Illness:  Patient is in today for Wegovy .  Kayla Combs has a history of morbid obesity complicated by hypertension, OSA, Lumbar disc disease, and hypertriglyceridemia. She is exercising regularly and made dietary changes. She started semaglutide  (Wegovy ) 0.25 mg weekly about a month ago. She is pleased with her early response. She is having some constipation, but is taking Benfiber and uses Senakot-S as needed.  Kayla Combs has a history of essential hypertension. She is managed on diltiazem  240 mg daily and acebutolol  400 mg BID.   Kayla Combs has a history of hypertriglyceridemia. She has added a daily fish oil supplement to her regimen.    Past Medical History: Patient Active Problem List   Diagnosis Date Noted   COVID-19 09/25/2023   Obstructive sleep apnea 08/31/2021   Frequent PVCs 08/23/2021   Rosacea 08/23/2021   Seasonal allergic rhinitis 08/23/2021   Degenerative lumbar spinal stenosis 06/26/2019   Disc displacement, lumbar 06/26/2019   Radiculopathy, lumbar region 05/13/2019   Vitamin D  deficiency 04/19/2018   Thyroid  nodule 01/21/2015   Hypocalcemia 01/21/2015   Leukocytosis 01/08/2015   Morbid obesity with BMI of 40.0-44.9, adult (HCC) 04/02/2013   Insomnia 11/27/2012   Hypertriglyceridemia 03/06/2012   Essential hypertension 03/06/2012   Generalized anxiety disorder 03/06/2012   Sciatica of left side 12/17/2008   Past Surgical History:  Procedure Laterality Date   CESAREAN SECTION     x 3    DILITATION & CURRETTAGE/HYSTROSCOPY WITH HYDROTHERMAL ABLATION N/A 06/11/2020   Procedure: DILATATION &  CURETTAGE/HYSTEROSCOPY WITH HYDROTHERMAL ABLATION, CERVICAL BIOPSY;  Surgeon: Dannielle Bouchard, DO;  Location: [facility];  Service: Gynecology;  Laterality: N/A;   DILITATION & CURRETTAGE/HYSTROSCOPY WITH HYDROTHERMAL ABLATION  06/05/2020   NAILBED REPAIR Right 10/16/2018   Procedure: BIOPSY RIGHT NAILBED MIDDLE FINGER;  Surgeon: Murrell Kuba, MD;  Location: [facility];  Service: Orthopedics;  Laterality: Right;  IV REGIONAL FOREARM BLOCK   TONSILLECTOMY AND ADENOIDECTOMY  as child age 65   WISDOM TOOTH EXTRACTION     1986-1987   Family History  Problem Relation Age of Onset   Hypertension Mother    Cervical cancer Mother    Hypertension Father    Colon cancer Neg Hx    Colon polyps Neg Hx    Esophageal cancer Neg Hx    Rectal cancer Neg Hx    Stomach cancer Neg Hx    Outpatient Medications Prior to Visit  Medication Sig Dispense Refill   acebutolol  (SECTRAL ) 400 MG capsule Take 1 capsule (400 mg total) by mouth 2 (two) times daily. 180 capsule 0   ALPRAZolam  (XANAX ) 0.25 MG tablet Take 1 tablet (0.25 mg total) by mouth daily as needed for anxiety. 90 tablet 0   betamethasone  dipropionate 0.05 % cream Apply topically 2 (two) times daily. (Patient not taking: Reported on 02/06/2024) 30 g 0   diltiazem  (CARDIZEM  CD) 240 MG 24 hr capsule Take 1 capsule (240 mg total) by mouth daily. 90 capsule 0   HYDROcodone  bit-homatropine (HYCODAN) 5-1.5 MG/5ML syrup Take 5 mLs by mouth every 8 (eight) hours as needed for cough. (Patient not taking:  Reported on 02/06/2024) 120 mL 0   ibuprofen  (ADVIL ) 200 MG tablet Take 200 mg by mouth every 6 (six) hours as needed.     metroNIDAZOLE (METROCREAM) 0.75 % cream as needed.     Multiple Vitamins-Minerals (CENTRUM SILVER PO) Take by mouth.     predniSONE  (DELTASONE ) 50 MG tablet Take 1 tablet (50 mg total) by mouth daily with breakfast. (Patient not taking: Reported on 02/06/2024) 5 tablet 0   VITAMIN D  PO Take 5,000 Units by mouth  daily.     zolpidem  (AMBIEN ) 10 MG tablet Take 1 tablet (10 mg total) by mouth at bedtime as needed for sleep. 90 tablet 0   No facility-administered medications prior to visit.   Allergies  Allergen Reactions   Aleve [Naproxen Sodium] Itching     Objective:   Today's Vitals   03/05/24 1559  BP: 126/74  Pulse: 81  Temp: 98.4 F (36.9 C)  TempSrc: Temporal  SpO2: 98%  Weight: 252 lb 6.4 oz (114.5 kg)  Height: 5' 3 (1.6 m)   Body mass index is 44.71 kg/m.   General: Well developed, well nourished. No acute distress. Psych: Alert and oriented. Normal mood and affect.  Health Maintenance Due  Topic Date Due   Hepatitis B Vaccines 19-59 Average Risk (1 of 3 - 19+ 3-dose series) Never done   Zoster Vaccines- Shingrix (1 of 2) Never done   Pneumococcal Vaccine: 50+ Years (1 of 1 - PCV) Never done   Cervical Cancer Screening (HPV/Pap Cotest)  04/10/2022   DTaP/Tdap/Td (3 - Td or Tdap) 09/12/2023   Influenza Vaccine  11/24/2023   COVID-19 Vaccine (5 - 2025-26 season) 12/25/2023   Lab Results    Latest Ref Rng & Units 02/07/2024    4:02 PM 05/26/2022    4:51 PM 05/31/2021    4:42 PM  CMP  Glucose 70 - 99 mg/dL 98  90  89   BUN 6 - 24 mg/dL 15  20  14    Creatinine 0.57 - 1.00 mg/dL 9.36  9.31  9.31   Sodium 134 - 144 mmol/L 139  141  139   Potassium 3.5 - 5.2 mmol/L 3.6  3.9  3.6   Chloride 96 - 106 mmol/L 101  103  101   CO2 20 - 29 mmol/L 20  23  29    Calcium 8.7 - 10.2 mg/dL 9.5  9.5  89.5   Total Protein 6.0 - 8.5 g/dL 7.1   7.9   Total Bilirubin 0.0 - 1.2 mg/dL 0.9   0.8   Alkaline Phos 49 - 135 IU/L 83   64   AST 0 - 40 IU/L 29   24   ALT 0 - 32 IU/L 35   31    Last lipids Lab Results  Component Value Date   CHOL 173 02/07/2024   HDL 39 (L) 02/07/2024   LDLCALC 104 (H) 02/07/2024   LDLDIRECT 98.0 08/25/2021   TRIG 174 (H) 02/07/2024   CHOLHDL 4.4 02/07/2024      Assessment & Plan:   Problem List Items Addressed This Visit       Cardiovascular and  Mediastinum   Essential hypertension   Blood pressure is in good control. Continue diltiazem  240 mg daily and acebutolol  400 mg BID.        Other   Hypertriglyceridemia   Morbid obesity with BMI of 40.0-44.9, adult (HCC) - Primary   Maximum weight: 267 lbs (04/2019) Current weight: 252 lbs Weight change since  last visit: - 10 lbs Total weight loss: - 15 lbs (5.6 %)  Discussed principles of weight management, including a foundation of a healthy, calorie-controlled diet and regular exercise. I recommend the patient achieve 150 minutes of moderate-intensity exercise weekly. We did discuss the role of medication to augment dietary and exercise efforts. We will increase her semaglutide  (Wegovy ) dose to 0.5 mg weekly for 4 weeks and then 1 mg weekly for 4 weeks. I will see her back in 2 months.       Relevant Medications   semaglutide -weight management (WEGOVY ) 0.5 MG/0.5ML SOAJ SQ injection   semaglutide -weight management (WEGOVY ) 1 MG/0.5ML SOAJ SQ injection    Return in about 2 months (around 05/05/2024) for Reassessment.    Garnette CHRISTELLA Simpler, MD  I,Emily Lagle,acting as a scribe for Garnette CHRISTELLA Simpler, MD.,have documented all relevant documentation on the behalf of Garnette CHRISTELLA Simpler, MD.  I, Garnette CHRISTELLA Simpler, MD, have reviewed all documentation for this visit. The documentation on 03/05/2024 for the exam, diagnosis, procedures, and orders are all accurate and complete.

## 2024-03-05 NOTE — Assessment & Plan Note (Addendum)
 Maximum weight: 267 lbs (04/2019) Current weight: 252 lbs Weight change since last visit: - 10 lbs Total weight loss: - 15 lbs (5.6 %)  Discussed principles of weight management, including a foundation of a healthy, calorie-controlled diet and regular exercise. I recommend the patient achieve 150 minutes of moderate-intensity exercise weekly. We did discuss the role of medication to augment dietary and exercise efforts. We will increase her semaglutide  (Wegovy ) dose to 0.5 mg weekly for 4 weeks and then 1 mg weekly for 4 weeks. I will see her back in 2 months.

## 2024-03-05 NOTE — Assessment & Plan Note (Addendum)
 Blood pressure is in good control. Continue diltiazem  240 mg daily and acebutolol  400 mg BID.

## 2024-03-15 ENCOUNTER — Encounter: Payer: Self-pay | Admitting: Cardiology

## 2024-03-15 ENCOUNTER — Other Ambulatory Visit: Payer: Self-pay | Admitting: Cardiology

## 2024-03-15 MED ORDER — ACEBUTOLOL HCL 400 MG PO CAPS: 400.0000 mg | ORAL_CAPSULE | Freq: Two times a day (BID) | ORAL | 0 refills | Status: DC

## 2024-03-15 MED ORDER — DILTIAZEM HCL ER COATED BEADS 240 MG PO CP24: 240.0000 mg | ORAL_CAPSULE | Freq: Every day | ORAL | 0 refills | Status: DC

## 2024-03-19 DIAGNOSIS — I493 Ventricular premature depolarization: Secondary | ICD-10-CM | POA: Diagnosis not present

## 2024-04-02 ENCOUNTER — Ambulatory Visit: Admitting: Family Medicine

## 2024-04-19 ENCOUNTER — Encounter: Payer: Self-pay | Admitting: Family Medicine

## 2024-04-19 NOTE — Telephone Encounter (Signed)
Send to provider  

## 2024-04-22 MED ORDER — WEGOVY 1.7 MG/0.75ML ~~LOC~~ SOAJ: 1.7000 mg | SUBCUTANEOUS | 1 refills | Status: DC

## 2024-05-06 ENCOUNTER — Encounter: Payer: Self-pay | Admitting: Family Medicine

## 2024-05-06 ENCOUNTER — Ambulatory Visit (INDEPENDENT_AMBULATORY_CARE_PROVIDER_SITE_OTHER): Admitting: Family Medicine

## 2024-05-06 ENCOUNTER — Other Ambulatory Visit: Payer: Self-pay | Admitting: Cardiology

## 2024-05-06 VITALS — BP 122/70 | HR 82 | Temp 98.1°F | Ht 63.0 in | Wt 241.6 lb

## 2024-05-06 DIAGNOSIS — I440 Atrioventricular block, first degree: Secondary | ICD-10-CM | POA: Insufficient documentation

## 2024-05-06 DIAGNOSIS — L719 Rosacea, unspecified: Secondary | ICD-10-CM | POA: Diagnosis not present

## 2024-05-06 DIAGNOSIS — I10 Essential (primary) hypertension: Secondary | ICD-10-CM

## 2024-05-06 DIAGNOSIS — E781 Pure hyperglyceridemia: Secondary | ICD-10-CM

## 2024-05-06 DIAGNOSIS — Z6841 Body Mass Index (BMI) 40.0 and over, adult: Secondary | ICD-10-CM

## 2024-05-06 MED ORDER — WEGOVY 1.7 MG/0.75ML ~~LOC~~ SOAJ: 1.7000 mg | SUBCUTANEOUS | 2 refills | Status: AC

## 2024-05-06 MED ORDER — METRONIDAZOLE 0.75 % EX CREA: TOPICAL_CREAM | CUTANEOUS | 2 refills | Status: DC | PRN

## 2024-05-06 NOTE — Assessment & Plan Note (Signed)
 Blood pressure is in good control. Continue diltiazem  240 mg daily and acebutolol  400 mg BID.

## 2024-05-06 NOTE — Assessment & Plan Note (Addendum)
 Maximum weight: 267 lbs (04/2019) Current weight: 241 lbs Weight change since last visit: - 11 lbs Total weight loss: - 26 lbs (9.7 %)  Encourage Kayla Combs to continue her current diet and exercise plan. Tolerating GLP-1 well. Continue semaglutide  (Wegovy ) 1.7 mg weekly.

## 2024-05-06 NOTE — Progress Notes (Signed)
 " [facility] [address] [place] [address] Dept: [phone] Dept Fax: [phone]  Chronic Care Office Visit  Subjective:    Patient ID: Kayla Combs, female    DOB: October 27, 1970, 54 y.o..   MRN: [id number]  Chief Complaint  Patient presents with   Obesity    2 month f/u Weight.     History of Present Illness:  Patient is in today for reassessment of chronic medical conditions.   Kayla Combs has a history of morbid obesity complicated by hypertension, OSA, lumbar disc disease, and hypertriglyceridemia. She is exercising regularly and made dietary changes. She started semaglutide  (Wegovy ) 0.25 mg weekly in October 2025. She has been pleased with her early response. She is having some constipation, but is taking Benfiber and uses Senakot-S as needed. She is currenlty on the 1.7 mg weekly dose.   Kayla Combs has a history of essential hypertension. She is managed on diltiazem  240 mg daily and acebutolol  400 mg BID.  Past Medical History: Patient Active Problem List   Diagnosis Date Noted   Obstructive sleep apnea 08/31/2021   Frequent PVCs 08/23/2021   Rosacea 08/23/2021   Seasonal allergic rhinitis 08/23/2021   Degenerative lumbar spinal stenosis 06/26/2019   Disc displacement, lumbar 06/26/2019   Radiculopathy, lumbar region 05/13/2019   Vitamin D  deficiency 04/19/2018   Thyroid  nodule 01/21/2015   Hypocalcemia 01/21/2015   Leukocytosis 01/08/2015   Morbid obesity with BMI of 40.0-44.9, adult (HCC) 04/02/2013   Insomnia 11/27/2012   Hypertriglyceridemia 03/06/2012   Essential hypertension 03/06/2012   Generalized anxiety disorder 03/06/2012   Sciatica of left side 12/17/2008   Past Surgical History:  Procedure Laterality Date   CESAREAN SECTION     x 3    DILITATION & CURRETTAGE/HYSTROSCOPY WITH HYDROTHERMAL ABLATION N/A 06/11/2020   Procedure: DILATATION & CURETTAGE/HYSTEROSCOPY WITH  HYDROTHERMAL ABLATION, CERVICAL BIOPSY;  Surgeon: Dannielle Bouchard, DO;  Location: [facility];  Service: Gynecology;  Laterality: N/A;   DILITATION & CURRETTAGE/HYSTROSCOPY WITH HYDROTHERMAL ABLATION  06/05/2020   NAILBED REPAIR Right 10/16/2018   Procedure: BIOPSY RIGHT NAILBED MIDDLE FINGER;  Surgeon: Murrell Kuba, MD;  Location: [facility];  Service: Orthopedics;  Laterality: Right;  IV REGIONAL FOREARM BLOCK   TONSILLECTOMY AND ADENOIDECTOMY  as child age 69   WISDOM TOOTH EXTRACTION     1986-1987   Family History  Problem Relation Age of Onset   Hypertension Mother    Cervical cancer Mother    Hypertension Father    Colon cancer Neg Hx    Colon polyps Neg Hx    Esophageal cancer Neg Hx    Rectal cancer Neg Hx    Stomach cancer Neg Hx    Outpatient Medications Prior to Visit  Medication Sig Dispense Refill   acebutolol  (SECTRAL ) 400 MG capsule Take 1 capsule (400 mg total) by mouth 2 (two) times daily. 180 capsule 0   ALPRAZolam  (XANAX ) 0.25 MG tablet Take 1 tablet (0.25 mg total) by mouth daily as needed for anxiety. 90 tablet 0   diltiazem  (CARDIZEM  CD) 240 MG 24 hr capsule Take 1 capsule (240 mg total) by mouth daily. 90 capsule 0   ibuprofen  (ADVIL ) 200 MG tablet Take 200 mg by mouth every 6 (six) hours as needed.     metroNIDAZOLE  (METROCREAM ) 0.75 % cream as needed.     Multiple Vitamins-Minerals (CENTRUM SILVER PO) Take by mouth.     Omega-3 Fatty Acids (FISH OIL) 1000 MG CAPS  semaglutide -weight management (WEGOVY ) 1.7 MG/0.75ML SOAJ SQ injection Inject 1.7 mg into the skin once a week. 3 mL 1   VITAMIN D  PO Take 5,000 Units by mouth daily.     zolpidem  (AMBIEN ) 10 MG tablet Take 1 tablet (10 mg total) by mouth at bedtime as needed for sleep. 90 tablet 0   No facility-administered medications prior to visit.   Allergies[1]   Objective:   Today's Vitals   05/06/24 1548  BP: 122/70  Pulse: 82  Temp: 98.1 F (36.7 C)  TempSrc: Temporal   SpO2: 96%  Weight: 241 lb 9.6 oz (109.6 kg)  Height: 5' 3 (1.6 m)   Body mass index is 42.8 kg/m.   General: Well developed, well nourished. No acute distress. Psych: Alert and oriented. Normal mood and affect.  Health Maintenance Due  Topic Date Due   Hepatitis B Vaccines 19-59 Average Risk (1 of 3 - 19+ 3-dose series) Never done   Zoster Vaccines- Shingrix (1 of 2) Never done   Pneumococcal Vaccine: 50+ Years (1 of 1 - PCV) Never done   Cervical Cancer Screening (HPV/Pap Cotest)  04/10/2022   DTaP/Tdap/Td (3 - Td or Tdap) 09/12/2023   COVID-19 Vaccine (5 - 2025-26 season) 12/25/2023   Procedure LONG TERM MONITOR (3-14 DAYS) Result Date: 03/19/2024 Patch Wear Time:  13 days and 23 hours (2024-02-18 to 2024-03-03)  Patient had a min HR of 61 bpm, max HR of 114 bpm, and avg HR of 80 bpm.  Predominant underlying rhythm was Sinus Rhythm.  First Degree AV Block was present.  Isolated SVEs were rare (<1.0%), SVE Couplets were rare (<1.0%), and no SVE Triplets were present.   Isolated VEs were rare (<1.0%), and no VE Couplets or VE Triplets were present.  No symptoms reported.  Conclusion: Normal/unremarkable study     Assessment & Plan:   Problem List Items Addressed This Visit       Cardiovascular and Mediastinum   Essential hypertension   Blood pressure is in good control. Continue diltiazem  240 mg daily and acebutolol  400 mg BID.        Other   Hypertriglyceridemia   Morbid obesity, Initial BMI = 46 (HCC) - Primary   Maximum weight: 267 lbs (04/2019) Current weight: 241 lbs Weight change since last visit: - 11 lbs Total weight loss: - 26 lbs (9.7 %)  Encourage Kayla Combs to continue her current diet and exercise plan. Tolerating GLP-1 well. Continue semaglutide  (Wegovy ) 1.7 mg weekly.      Relevant Medications   semaglutide -weight management (WEGOVY ) 1.7 MG/0.75ML SOAJ SQ injection   Rosacea   Relevant Medications   metroNIDAZOLE  (METROCREAM ) 0.75 % cream     Return in about 2 months (around 07/04/2024).   Garnette CHRISTELLA Simpler, MD  I,Emily Lagle,acting as a scribe for Garnette CHRISTELLA Simpler, MD.,have documented all relevant documentation on the behalf of Garnette CHRISTELLA Simpler, MD.  I, Garnette CHRISTELLA Simpler, MD, have reviewed all documentation for this visit. The documentation on 05/06/2024 for the exam, diagnosis, procedures, and orders are all accurate and complete.     [1]  Allergies Allergen Reactions   Aleve [Naproxen Sodium] Itching   "

## 2024-05-07 MED ORDER — METRONIDAZOLE 0.75 % EX CREA: TOPICAL_CREAM | CUTANEOUS | 2 refills | Status: AC | PRN

## 2024-05-07 NOTE — Addendum Note (Signed)
 Addended by: KYM KARNA CROME on: 05/07/2024 10:07 AM   Modules accepted: Orders

## 2024-05-15 ENCOUNTER — Other Ambulatory Visit (HOSPITAL_COMMUNITY): Payer: Self-pay

## 2024-05-15 ENCOUNTER — Telehealth: Payer: Self-pay

## 2024-05-15 NOTE — Telephone Encounter (Signed)
 Pharmacy Patient Advocate Encounter   Received notification from Onbase CMM KEY that prior authorization for Wegovy  1.7MG /0.75ML auto-injectors  is required/requested.   Insurance verification completed.   The patient is insured through [facility].   Per test claim: Per test claim, medication is not covered due to plan/benefit exclusion, PA not submitted at this time

## 2024-07-08 ENCOUNTER — Ambulatory Visit: Admitting: Family Medicine
